# Patient Record
Sex: Female | Born: 1935 | Race: White | Hispanic: No | Smoking: Never smoker
Health system: Southern US, Community
[De-identification: ages and names within clinical notes are randomized; demographics above are authoritative.]

## PROBLEM LIST (undated history)

## (undated) DIAGNOSIS — I10 Essential (primary) hypertension: Secondary | ICD-10-CM

## (undated) DIAGNOSIS — M1990 Unspecified osteoarthritis, unspecified site: Secondary | ICD-10-CM

## (undated) DIAGNOSIS — C801 Malignant (primary) neoplasm, unspecified: Secondary | ICD-10-CM

## (undated) DIAGNOSIS — Z9889 Other specified postprocedural states: Secondary | ICD-10-CM

## (undated) DIAGNOSIS — F32A Depression, unspecified: Secondary | ICD-10-CM

## (undated) DIAGNOSIS — T4145XA Adverse effect of unspecified anesthetic, initial encounter: Secondary | ICD-10-CM

## (undated) DIAGNOSIS — Z87442 Personal history of urinary calculi: Secondary | ICD-10-CM

## (undated) DIAGNOSIS — T8859XA Other complications of anesthesia, initial encounter: Secondary | ICD-10-CM

## (undated) DIAGNOSIS — Z923 Personal history of irradiation: Secondary | ICD-10-CM

## (undated) DIAGNOSIS — K635 Polyp of colon: Secondary | ICD-10-CM

## (undated) DIAGNOSIS — F329 Major depressive disorder, single episode, unspecified: Secondary | ICD-10-CM

## (undated) DIAGNOSIS — C50919 Malignant neoplasm of unspecified site of unspecified female breast: Secondary | ICD-10-CM

## (undated) DIAGNOSIS — R112 Nausea with vomiting, unspecified: Secondary | ICD-10-CM

## (undated) HISTORY — DX: Unspecified osteoarthritis, unspecified site: M19.90

## (undated) HISTORY — DX: Personal history of irradiation: Z92.3

## (undated) HISTORY — DX: Malignant neoplasm of unspecified site of unspecified female breast: C50.919

## (undated) HISTORY — PX: ABDOMINAL HYSTERECTOMY: SHX81

## (undated) HISTORY — DX: Essential (primary) hypertension: I10

## (undated) HISTORY — DX: Malignant (primary) neoplasm, unspecified: C80.1

## (undated) HISTORY — DX: Polyp of colon: K63.5

---

## 1999-12-19 ENCOUNTER — Encounter: Payer: Self-pay | Admitting: Family Medicine

## 1999-12-19 ENCOUNTER — Encounter: Admission: RE | Admit: 1999-12-19 | Discharge: 1999-12-19 | Payer: Self-pay | Admitting: Family Medicine

## 2000-12-13 ENCOUNTER — Inpatient Hospital Stay (HOSPITAL_COMMUNITY): Admission: EM | Admit: 2000-12-13 | Discharge: 2000-12-15 | Payer: Self-pay | Admitting: Emergency Medicine

## 2000-12-14 ENCOUNTER — Encounter: Payer: Self-pay | Admitting: Urology

## 2000-12-21 ENCOUNTER — Encounter: Payer: Self-pay | Admitting: Family Medicine

## 2000-12-21 ENCOUNTER — Encounter: Admission: RE | Admit: 2000-12-21 | Discharge: 2000-12-21 | Payer: Self-pay | Admitting: Family Medicine

## 2000-12-28 ENCOUNTER — Encounter: Admission: RE | Admit: 2000-12-28 | Discharge: 2000-12-28 | Payer: Self-pay | Admitting: Urology

## 2000-12-28 ENCOUNTER — Encounter: Payer: Self-pay | Admitting: Urology

## 2001-04-02 ENCOUNTER — Encounter: Payer: Self-pay | Admitting: Urology

## 2001-04-02 ENCOUNTER — Encounter: Admission: RE | Admit: 2001-04-02 | Discharge: 2001-04-02 | Payer: Self-pay | Admitting: Urology

## 2001-06-17 ENCOUNTER — Ambulatory Visit (HOSPITAL_BASED_OUTPATIENT_CLINIC_OR_DEPARTMENT_OTHER): Admission: RE | Admit: 2001-06-17 | Discharge: 2001-06-17 | Payer: Self-pay | Admitting: Urology

## 2001-06-17 ENCOUNTER — Encounter: Payer: Self-pay | Admitting: Urology

## 2001-07-16 ENCOUNTER — Encounter: Payer: Self-pay | Admitting: Urology

## 2001-07-16 ENCOUNTER — Encounter: Admission: RE | Admit: 2001-07-16 | Discharge: 2001-07-16 | Payer: Self-pay | Admitting: Urology

## 2001-09-27 ENCOUNTER — Emergency Department (HOSPITAL_COMMUNITY): Admission: EM | Admit: 2001-09-27 | Discharge: 2001-09-27 | Payer: Self-pay | Admitting: Emergency Medicine

## 2001-09-27 ENCOUNTER — Encounter: Payer: Self-pay | Admitting: Emergency Medicine

## 2001-11-23 ENCOUNTER — Encounter: Admission: RE | Admit: 2001-11-23 | Discharge: 2001-11-23 | Payer: Self-pay | Admitting: Urology

## 2001-11-23 ENCOUNTER — Encounter: Payer: Self-pay | Admitting: Urology

## 2002-01-04 ENCOUNTER — Encounter: Admission: RE | Admit: 2002-01-04 | Discharge: 2002-01-04 | Payer: Self-pay | Admitting: Family Medicine

## 2002-01-04 ENCOUNTER — Encounter: Payer: Self-pay | Admitting: Family Medicine

## 2002-03-14 ENCOUNTER — Encounter: Admission: RE | Admit: 2002-03-14 | Discharge: 2002-03-14 | Payer: Self-pay | Admitting: Family Medicine

## 2002-03-14 ENCOUNTER — Encounter: Payer: Self-pay | Admitting: Family Medicine

## 2003-01-06 ENCOUNTER — Encounter: Admission: RE | Admit: 2003-01-06 | Discharge: 2003-01-06 | Payer: Self-pay | Admitting: Family Medicine

## 2003-01-06 ENCOUNTER — Encounter: Payer: Self-pay | Admitting: Family Medicine

## 2004-01-08 ENCOUNTER — Ambulatory Visit (HOSPITAL_COMMUNITY): Admission: RE | Admit: 2004-01-08 | Discharge: 2004-01-08 | Payer: Self-pay | Admitting: Family Medicine

## 2005-01-08 ENCOUNTER — Ambulatory Visit (HOSPITAL_COMMUNITY): Admission: RE | Admit: 2005-01-08 | Discharge: 2005-01-08 | Payer: Self-pay | Admitting: Family Medicine

## 2006-01-13 ENCOUNTER — Ambulatory Visit (HOSPITAL_COMMUNITY): Admission: RE | Admit: 2006-01-13 | Discharge: 2006-01-13 | Payer: Self-pay | Admitting: Family Medicine

## 2006-01-16 ENCOUNTER — Encounter: Admission: RE | Admit: 2006-01-16 | Discharge: 2006-01-16 | Payer: Self-pay | Admitting: Family Medicine

## 2007-02-09 ENCOUNTER — Ambulatory Visit (HOSPITAL_COMMUNITY): Admission: RE | Admit: 2007-02-09 | Discharge: 2007-02-09 | Payer: Self-pay | Admitting: Family Medicine

## 2008-02-16 ENCOUNTER — Ambulatory Visit (HOSPITAL_COMMUNITY): Admission: RE | Admit: 2008-02-16 | Discharge: 2008-02-16 | Payer: Self-pay | Admitting: Family Medicine

## 2009-03-16 ENCOUNTER — Ambulatory Visit (HOSPITAL_COMMUNITY): Admission: RE | Admit: 2009-03-16 | Discharge: 2009-03-16 | Payer: Self-pay | Admitting: Family Medicine

## 2009-06-09 HISTORY — PX: BREAST SURGERY: SHX581

## 2009-10-02 ENCOUNTER — Encounter: Admission: RE | Admit: 2009-10-02 | Discharge: 2009-10-02 | Payer: Self-pay | Admitting: Family Medicine

## 2010-04-02 ENCOUNTER — Ambulatory Visit (HOSPITAL_COMMUNITY): Admission: RE | Admit: 2010-04-02 | Discharge: 2010-04-02 | Payer: Self-pay | Admitting: Family Medicine

## 2010-04-17 ENCOUNTER — Encounter: Admission: RE | Admit: 2010-04-17 | Discharge: 2010-04-17 | Payer: Self-pay | Admitting: Family Medicine

## 2010-04-22 ENCOUNTER — Encounter: Admission: RE | Admit: 2010-04-22 | Discharge: 2010-04-22 | Payer: Self-pay | Admitting: Family Medicine

## 2010-04-30 ENCOUNTER — Encounter: Admission: RE | Admit: 2010-04-30 | Discharge: 2010-04-30 | Payer: Self-pay | Admitting: Family Medicine

## 2010-05-07 ENCOUNTER — Ambulatory Visit
Admission: RE | Admit: 2010-05-07 | Discharge: 2010-06-06 | Payer: Self-pay | Source: Home / Self Care | Attending: Radiation Oncology | Admitting: Radiation Oncology

## 2010-05-20 ENCOUNTER — Encounter
Admission: RE | Admit: 2010-05-20 | Discharge: 2010-05-20 | Payer: Self-pay | Source: Home / Self Care | Attending: Surgery | Admitting: Surgery

## 2010-05-20 ENCOUNTER — Ambulatory Visit: Payer: Self-pay | Admitting: Psychiatry

## 2010-05-23 ENCOUNTER — Encounter
Admission: RE | Admit: 2010-05-23 | Discharge: 2010-05-23 | Payer: Self-pay | Source: Home / Self Care | Attending: Surgery | Admitting: Surgery

## 2010-05-23 ENCOUNTER — Ambulatory Visit
Admission: RE | Admit: 2010-05-23 | Discharge: 2010-05-23 | Payer: Self-pay | Source: Home / Self Care | Attending: Surgery | Admitting: Surgery

## 2010-06-13 ENCOUNTER — Ambulatory Visit
Admission: RE | Admit: 2010-06-13 | Discharge: 2010-07-09 | Payer: Self-pay | Source: Home / Self Care | Attending: Radiation Oncology | Admitting: Radiation Oncology

## 2010-06-19 ENCOUNTER — Ambulatory Visit: Admit: 2010-06-19 | Payer: Self-pay | Admitting: Psychiatry

## 2010-06-30 ENCOUNTER — Encounter: Payer: Self-pay | Admitting: Family Medicine

## 2010-07-09 ENCOUNTER — Ambulatory Visit: Payer: Self-pay | Admitting: Oncology

## 2010-07-10 ENCOUNTER — Ambulatory Visit: Payer: Medicare Other | Attending: Radiation Oncology | Admitting: Radiation Oncology

## 2010-07-10 DIAGNOSIS — Z510 Encounter for antineoplastic radiation therapy: Secondary | ICD-10-CM | POA: Insufficient documentation

## 2010-07-10 DIAGNOSIS — C50419 Malignant neoplasm of upper-outer quadrant of unspecified female breast: Secondary | ICD-10-CM | POA: Insufficient documentation

## 2010-08-08 ENCOUNTER — Encounter: Payer: Medicare Other | Admitting: Oncology

## 2010-08-19 LAB — CBC
HCT: 42.7 % (ref 36.0–46.0)
Hemoglobin: 14.2 g/dL (ref 12.0–15.0)
MCV: 95.3 fL (ref 78.0–100.0)
RBC: 4.48 MIL/uL (ref 3.87–5.11)
WBC: 8.4 10*3/uL (ref 4.0–10.5)

## 2010-08-19 LAB — DIFFERENTIAL
Basophils Absolute: 0.1 10*3/uL (ref 0.0–0.1)
Basophils Relative: 1 % (ref 0–1)
Eosinophils Absolute: 0.1 10*3/uL (ref 0.0–0.7)
Monocytes Absolute: 0.6 10*3/uL (ref 0.1–1.0)
Neutro Abs: 6 10*3/uL (ref 1.7–7.7)
Neutrophils Relative %: 72 % (ref 43–77)

## 2010-08-19 LAB — COMPREHENSIVE METABOLIC PANEL
Alkaline Phosphatase: 48 U/L (ref 39–117)
BUN: 24 mg/dL — ABNORMAL HIGH (ref 6–23)
Chloride: 105 mEq/L (ref 96–112)
Glucose, Bld: 97 mg/dL (ref 70–99)
Potassium: 4.3 mEq/L (ref 3.5–5.1)
Total Bilirubin: 0.6 mg/dL (ref 0.3–1.2)

## 2010-09-02 ENCOUNTER — Ambulatory Visit: Payer: Medicare Other | Attending: Radiation Oncology | Admitting: Radiation Oncology

## 2010-09-30 ENCOUNTER — Encounter (HOSPITAL_BASED_OUTPATIENT_CLINIC_OR_DEPARTMENT_OTHER): Payer: Medicare Other | Admitting: Oncology

## 2010-09-30 DIAGNOSIS — M129 Arthropathy, unspecified: Secondary | ICD-10-CM

## 2010-09-30 DIAGNOSIS — C50419 Malignant neoplasm of upper-outer quadrant of unspecified female breast: Secondary | ICD-10-CM

## 2010-09-30 DIAGNOSIS — I10 Essential (primary) hypertension: Secondary | ICD-10-CM

## 2010-12-04 ENCOUNTER — Encounter (HOSPITAL_BASED_OUTPATIENT_CLINIC_OR_DEPARTMENT_OTHER): Payer: Medicare Other | Admitting: Oncology

## 2010-12-04 DIAGNOSIS — C50419 Malignant neoplasm of upper-outer quadrant of unspecified female breast: Secondary | ICD-10-CM

## 2010-12-04 DIAGNOSIS — M129 Arthropathy, unspecified: Secondary | ICD-10-CM

## 2010-12-04 DIAGNOSIS — I10 Essential (primary) hypertension: Secondary | ICD-10-CM

## 2011-03-10 ENCOUNTER — Ambulatory Visit
Admission: RE | Admit: 2011-03-10 | Discharge: 2011-03-10 | Disposition: A | Discharge status: Home / Self Care | Payer: Medicare Other | Source: Ambulatory Visit | Attending: Radiation Oncology | Admitting: Radiation Oncology

## 2011-03-11 ENCOUNTER — Other Ambulatory Visit: Payer: Self-pay | Admitting: Family Medicine

## 2011-03-11 DIAGNOSIS — C50919 Malignant neoplasm of unspecified site of unspecified female breast: Secondary | ICD-10-CM

## 2011-03-11 DIAGNOSIS — Z9889 Other specified postprocedural states: Secondary | ICD-10-CM

## 2011-04-04 ENCOUNTER — Ambulatory Visit
Admission: RE | Admit: 2011-04-04 | Discharge: 2011-04-04 | Disposition: A | Discharge status: Home / Self Care | Payer: Medicare Other | Source: Ambulatory Visit | Attending: Family Medicine | Admitting: Family Medicine

## 2011-04-04 DIAGNOSIS — C50919 Malignant neoplasm of unspecified site of unspecified female breast: Secondary | ICD-10-CM

## 2011-04-04 DIAGNOSIS — Z9889 Other specified postprocedural states: Secondary | ICD-10-CM

## 2011-05-07 ENCOUNTER — Encounter (INDEPENDENT_AMBULATORY_CARE_PROVIDER_SITE_OTHER): Payer: Self-pay | Admitting: General Surgery

## 2011-05-09 ENCOUNTER — Ambulatory Visit (INDEPENDENT_AMBULATORY_CARE_PROVIDER_SITE_OTHER): Payer: Medicare Other | Admitting: Surgery

## 2011-05-09 ENCOUNTER — Encounter (INDEPENDENT_AMBULATORY_CARE_PROVIDER_SITE_OTHER): Payer: Self-pay | Admitting: Surgery

## 2011-05-09 VITALS — BP 142/80 | HR 70 | Temp 97.8°F | Resp 16 | Ht 64.0 in | Wt 128.0 lb

## 2011-05-09 DIAGNOSIS — Z853 Personal history of malignant neoplasm of breast: Secondary | ICD-10-CM

## 2011-05-09 NOTE — Progress Notes (Signed)
Subjective:     Patient ID: Sherri Tucker, female   DOB: 06/24/1934, 76 y.o.   MRN: [id number]  HPI The patient returns for 6 month followup of history of left breast cancer. She underwent a lumpectomy with postoperative vision therapy for a T1 N0 MX ER/PR positive HER-2/neu negative left breast cancer in December 2011. She has no complaints.  Review of Systems  Constitutional: Negative for fever, chills and unexpected weight change.  HENT: Negative for hearing loss, congestion, sore throat, trouble swallowing and voice change.   Eyes: Negative for visual disturbance.  Respiratory: Negative for cough and wheezing.   Cardiovascular: Negative for chest pain, palpitations and leg swelling.  Gastrointestinal: Negative for nausea, vomiting, abdominal pain, diarrhea, constipation, blood in stool, abdominal distention and anal bleeding.  Genitourinary: Negative for hematuria, vaginal bleeding and difficulty urinating.  Musculoskeletal: Negative for arthralgias.  Skin: Negative for rash and wound.  Neurological: Negative for seizures, syncope and headaches.  Hematological: Negative for adenopathy. Does not bruise/bleed easily.  Psychiatric/Behavioral: Negative for confusion.       Objective:   Physical Exam  Constitutional: She appears well-developed and well-nourished.  HENT:  Head: Normocephalic and atraumatic.  Eyes: EOM are normal. Pupils are equal, round, and reactive to light.  Neck: Normal range of motion. Neck supple.  Cardiovascular: Normal rate and regular rhythm.   Pulmonary/Chest: Effort normal and breath sounds normal.       Left breast shows postsurgical changes. Minimal volume loss. No masses. Left axilla normal. Right breast normal. Right axilla normal.  Lymphadenopathy:    She has no cervical adenopathy.       Assessment:     Stage I left breast cancer history    Plan:     Her mammogram from October of this year was a BI-RADS 2. She is doing well. Return to see me  in one year

## 2011-05-09 NOTE — Patient Instructions (Signed)
Follow up in 1 year.

## 2011-05-16 ENCOUNTER — Telehealth: Payer: Self-pay | Admitting: Oncology

## 2011-05-16 NOTE — Telephone Encounter (Signed)
called pt and scheduled her appts for [id number]

## 2011-12-04 ENCOUNTER — Ambulatory Visit (HOSPITAL_BASED_OUTPATIENT_CLINIC_OR_DEPARTMENT_OTHER): Payer: Medicare Other | Admitting: Oncology

## 2011-12-04 ENCOUNTER — Other Ambulatory Visit: Payer: Medicare Other | Admitting: Lab

## 2011-12-04 ENCOUNTER — Encounter: Payer: Self-pay | Admitting: Oncology

## 2011-12-04 ENCOUNTER — Telehealth: Payer: Self-pay | Admitting: Oncology

## 2011-12-04 VITALS — BP 145/69 | HR 65 | Temp 98.0°F | Ht 64.0 in | Wt 127.3 lb

## 2011-12-04 DIAGNOSIS — C50419 Malignant neoplasm of upper-outer quadrant of unspecified female breast: Secondary | ICD-10-CM

## 2011-12-04 DIAGNOSIS — C50412 Malignant neoplasm of upper-outer quadrant of left female breast: Secondary | ICD-10-CM | POA: Insufficient documentation

## 2011-12-04 DIAGNOSIS — Z170 Estrogen receptor positive status [ER+]: Secondary | ICD-10-CM

## 2011-12-04 DIAGNOSIS — Z923 Personal history of irradiation: Secondary | ICD-10-CM

## 2011-12-04 DIAGNOSIS — M1990 Unspecified osteoarthritis, unspecified site: Secondary | ICD-10-CM

## 2011-12-04 DIAGNOSIS — C50919 Malignant neoplasm of unspecified site of unspecified female breast: Secondary | ICD-10-CM

## 2011-12-04 HISTORY — DX: Personal history of irradiation: Z92.3

## 2011-12-04 HISTORY — DX: Malignant neoplasm of unspecified site of unspecified female breast: C50.919

## 2011-12-04 HISTORY — DX: Unspecified osteoarthritis, unspecified site: M19.90

## 2011-12-04 LAB — COMPREHENSIVE METABOLIC PANEL
ALT: 11 U/L (ref 0–35)
AST: 17 U/L (ref 0–37)
Albumin: 4.2 g/dL (ref 3.5–5.2)
Alkaline Phosphatase: 52 U/L (ref 39–117)
BUN: 25 mg/dL — ABNORMAL HIGH (ref 6–23)
Calcium: 9.9 mg/dL (ref 8.4–10.5)
Chloride: 103 mEq/L (ref 96–112)
Potassium: 3.6 mEq/L (ref 3.5–5.3)
Sodium: 140 mEq/L (ref 135–145)
Total Protein: 7.1 g/dL (ref 6.0–8.3)

## 2011-12-04 LAB — CBC WITH DIFFERENTIAL/PLATELET
Basophils Absolute: 0.1 10*3/uL (ref 0.0–0.1)
EOS%: 1.3 % (ref 0.0–7.0)
HGB: 13.8 g/dL (ref 11.6–15.9)
MCH: 32.5 pg (ref 25.1–34.0)
NEUT#: 4.5 10*3/uL (ref 1.5–6.5)
RBC: 4.24 10*6/uL (ref 3.70–5.45)
RDW: 13.3 % (ref 11.2–14.5)
lymph#: 1.1 10*3/uL (ref 0.9–3.3)

## 2011-12-04 NOTE — Progress Notes (Signed)
OFFICE PROGRESS NOTE  CC  Sherri Confer, MD [address] [place] [address] Dr. Harriette Bouillon Dr. Antony Blackbird  DIAGNOSIS: 77 year old female with stage I invasive lobular carcinoma diagnosed December 2011.  PRIOR THERAPY:  #1 patient underwent a lumpectomy on 05/23/2010 that revealed a stage I invasive ductal carcinoma measuring 1.5 cm grade 1 ER positive PR positive with a Ki-67 of 8% and HER-2/neu negative. 2 sentinel nodes were negative for metastatic disease.  #2 patient went on to receive radiation therapy to the left breast from degenerative 31 2012 to 08/05/2010 for a total of 5000 cGY.  #3 patient declined any adjuvant antiestrogen therapy. And she has been on observation only.  CURRENT THERAPY:Observation  INTERVAL HISTORY: Sherri Tucker 77 y.o. female returns for Followup visit today overall she is doing well she denies any fevers chills night sweats headaches chest pains palpitations she has not noticed any masses. She had mammograms performed that revealed no evidence of local recurrence. Remainder of the 10 point review of systems is negative.  MEDICAL HISTORY: Past Medical History  Diagnosis Date  . Cancer   . Hemorrhoids   . Colon polyp   . Arthritis   . Hypertension   . Breast cancer, stage 1 12/04/2011  . Arthritis 12/04/2011  . History of radiation therapy 12/04/2011    ALLERGIES:  is allergic to codeine.  MEDICATIONS:  Current Outpatient Prescriptions  Medication Sig Dispense Refill  . aspirin 81 MG tablet Take 81 mg by mouth daily.        . fish oil-omega-3 fatty acids 1000 MG capsule Take 1,200 mg by mouth daily.        . hydrochlorothiazide (HYDRODIURIL) 25 MG tablet Take 25 mg by mouth daily.        Marland Kitchen lisinopril (PRINIVIL,ZESTRIL) 10 MG tablet Take 10 mg by mouth daily.        . metoprolol (TOPROL-XL) 100 MG 24 hr tablet Take 100 mg by mouth daily.        . Multiple Vitamin (MULTIVITAMIN) tablet Take 1 tablet by mouth daily.       . sertraline (ZOLOFT) 50 MG tablet Daily. Patient takes 1/2 tablet daily. Only taking 25 mg total a day.        SURGICAL HISTORY:  Past Surgical History  Procedure Date  . Abdominal hysterectomy     REVIEW OF SYSTEMS:  A comprehensive review of systems was negative.   PHYSICAL EXAMINATION: General appearance: alert and cooperative Neck: no adenopathy, no carotid bruit, no JVD, supple, symmetrical, trachea midline and thyroid not enlarged, symmetric, no tenderness/mass/nodules Lymph nodes: Cervical, supraclavicular, and axillary nodes normal. Resp: clear to auscultation bilaterally and normal percussion bilaterally Back: symmetric, no curvature. ROM normal. No CVA tenderness. Cardio: regular rate and rhythm, S1, S2 normal, no murmur, click, rub or gallop GI: soft, non-tender; bowel sounds normal; no masses,  no organomegaly Extremities: extremities normal, atraumatic, no cyanosis or edema Neurologic: Grossly normal Bilateral breast examination right breast no masses nipple discharge or skin changes left breast well-healed incisional scar without any masses nipple discharge. ECOG PERFORMANCE STATUS: 1 - Symptomatic but completely ambulatory  Blood pressure 145/69, pulse 65, temperature 98 F (36.7 C), temperature source Oral, height 5\' 4"  (1.626 m), weight 127 lb 4.8 oz (57.743 kg).  LABORATORY DATA: Lab Results  Component Value Date   WBC 6.1 12/04/2011   HGB 13.8 12/04/2011   HCT 40.4 12/04/2011   MCV 95.3 12/04/2011   PLT 197 12/04/2011  Chemistry      Component Value Date/Time   NA 141 05/20/2010 1132   K 4.3 05/20/2010 1132   CL 105 05/20/2010 1132   CO2 28 05/20/2010 1132   BUN 24* 05/20/2010 1132   CREATININE 0.83 05/20/2010 1132      Component Value Date/Time   CALCIUM 9.9 05/20/2010 1132   ALKPHOS 48 05/20/2010 1132   AST 22 05/20/2010 1132   ALT 18 05/20/2010 1132   BILITOT 0.6 05/20/2010 1132       RADIOGRAPHIC STUDIES:  No results  found.  ASSESSMENT: 77 year old female with history of stage I invasive lobular carcinoma measuring 1.5 cm grade 1 ER positive PR positive with a Ki-67 of 8% and HER-2/neu negative. Patient had 2 sentinel nodes that were negative for metastatic disease. Postoperatively she received radiation therapy to the breast and completed this 04/05/2011. She declined antiestrogen therapy. She presents today for followup.   PLAN: Overall patient is doing well she is really without any significant complaints her mammograms are up-to-date. At this time she and I discussed continuing followups on a yearly basis.   All questions were answered. The patient knows to call the clinic with any problems, questions or concerns. We can certainly see the patient much sooner if necessary.  I spent 25 minutes counseling the patient face to face. The total time spent in the appointment was 30 minutes.    Drue Second, MD Medical/Oncology [facility] [phone] (beeper) [phone] (Office)  12/04/2011, 11:51 AM

## 2011-12-04 NOTE — Patient Instructions (Addendum)
1. You are doing well. Your blood work from today looks good.  2. i will continue to see you once a year

## 2011-12-04 NOTE — Telephone Encounter (Signed)
gve the pt her June 2014 appt calendar 

## 2012-03-10 ENCOUNTER — Other Ambulatory Visit: Payer: Self-pay | Admitting: Family Medicine

## 2012-03-10 DIAGNOSIS — Z853 Personal history of malignant neoplasm of breast: Secondary | ICD-10-CM

## 2012-04-05 ENCOUNTER — Ambulatory Visit
Admission: RE | Admit: 2012-04-05 | Discharge: 2012-04-05 | Disposition: A | Discharge status: Home / Self Care | Payer: Medicare Other | Source: Ambulatory Visit | Attending: Family Medicine | Admitting: Family Medicine

## 2012-04-05 DIAGNOSIS — Z853 Personal history of malignant neoplasm of breast: Secondary | ICD-10-CM

## 2012-04-12 ENCOUNTER — Ambulatory Visit (INDEPENDENT_AMBULATORY_CARE_PROVIDER_SITE_OTHER): Payer: Medicare Other | Admitting: Surgery

## 2012-04-12 ENCOUNTER — Encounter (INDEPENDENT_AMBULATORY_CARE_PROVIDER_SITE_OTHER): Payer: Self-pay | Admitting: Surgery

## 2012-04-12 VITALS — BP 130/76 | HR 64 | Temp 97.2°F | Resp 14 | Ht 63.0 in | Wt 125.2 lb

## 2012-04-12 DIAGNOSIS — Z853 Personal history of malignant neoplasm of breast: Secondary | ICD-10-CM

## 2012-04-12 NOTE — Progress Notes (Signed)
Subjective:     Patient ID: Sherri Tucker, female   DOB: [birth year], 77 y.o.   MRN: [id number]  HPI The patient returns for 12 month followup of history of left breast cancer. She underwent a lumpectomy with postoperative vision therapy for a T1 N0 MX ER/PR positive HER-2/neu negative left breast cancer in December 2011. She has no complaints.  Review of Systems  Constitutional: Negative for fever, chills and unexpected weight change.  HENT: Negative for hearing loss, congestion, sore throat, trouble swallowing and voice change.   Eyes: Negative for visual disturbance.  Respiratory: Negative for cough and wheezing.   Cardiovascular: Negative for chest pain, palpitations and leg swelling.  Gastrointestinal: Negative for nausea, vomiting, abdominal pain, diarrhea, constipation, blood in stool, abdominal distention and anal bleeding.  Genitourinary: Negative for hematuria, vaginal bleeding and difficulty urinating.  Musculoskeletal: Negative for arthralgias.  Skin: Negative for rash and wound.  Neurological: Negative for seizures, syncope and headaches.  Hematological: Negative for adenopathy. Does not bruise/bleed easily.  Psychiatric/Behavioral: Negative for confusion.       Objective:   Physical Exam  Constitutional: She appears well-developed and well-nourished.  HENT:  Head: Normocephalic and atraumatic.  Eyes: EOM are normal. Pupils are equal, round, and reactive to light.  Neck: Normal range of motion. Neck supple.  Cardiovascular: Normal rate and regular rhythm.   Pulmonary/Chest: Effort normal and breath sounds normal.       Left breast shows postsurgical changes. Minimal volume loss. No masses. Left axilla normal. Right breast normal. Right axilla normal.  Lymphadenopathy:    She has no cervical adenopathy.  Clinical Data: History of left breast cancer, diagnosed in 2011.  Annual examination.  DIGITAL DIAGNOSTIC BILATERAL MAMMOGRAM WITH CAD  Comparison: With priors    Findings: The breast parenchyma is extremely dense bilaterally.  There are lumpectomy changes in the superior left breast centrally.  No mass, nonsurgical distortion, or suspicious mass occasion is  identified in either breast to suggest malignancy.  Mammographic images were processed with CAD.  IMPRESSION:  Surgical scarring in the upper central left breast. No  mammographic evidence of malignancy  RECOMMENDATION:  Bilateral diagnostic mammogram in 1 year. Given the patient's  extremely dense breast parenchyma, screening breast MRI could be  considered.  I have discussed the findings and recommendations with the patient.  Results were also provided in writing at the conclusion of the  visit.  BI-RADS CATEGORY 2: Benign finding(s).  Original Report Authenticated By: Britta Mccreedy, M.D.       Assessment:     Stage I left breast cancer history    Plan:     Her mammogram from October of this year was a BI-RADS 2. She is doing well. Return to see me in one year

## 2012-04-12 NOTE — Patient Instructions (Signed)
Return 1 year. 

## 2012-12-06 ENCOUNTER — Other Ambulatory Visit: Payer: Medicare Other | Admitting: Lab

## 2012-12-06 ENCOUNTER — Ambulatory Visit (HOSPITAL_BASED_OUTPATIENT_CLINIC_OR_DEPARTMENT_OTHER): Payer: 59 | Admitting: Oncology

## 2012-12-06 ENCOUNTER — Telehealth: Payer: Self-pay | Admitting: *Deleted

## 2012-12-06 ENCOUNTER — Encounter: Payer: Self-pay | Admitting: Oncology

## 2012-12-06 VITALS — BP 148/73 | HR 67 | Temp 98.0°F | Resp 20 | Ht 63.0 in | Wt 125.8 lb

## 2012-12-06 DIAGNOSIS — C50919 Malignant neoplasm of unspecified site of unspecified female breast: Secondary | ICD-10-CM

## 2012-12-06 DIAGNOSIS — C50912 Malignant neoplasm of unspecified site of left female breast: Secondary | ICD-10-CM

## 2012-12-06 NOTE — Telephone Encounter (Signed)
appts made and printed...td 

## 2012-12-06 NOTE — Progress Notes (Signed)
OFFICE PROGRESS NOTE  CC  Elie Confer, MD [address] [place] [address] Dr. Harriette Bouillon Dr. Antony Blackbird  DIAGNOSIS: 78 year old female with stage I invasive lobular carcinoma diagnosed December 2011.  PRIOR THERAPY:  #1 patient underwent a lumpectomy on 05/23/2010 that revealed a stage I invasive ductal carcinoma measuring 1.5 cm grade 1 ER positive PR positive with a Ki-67 of 8% and HER-2/neu negative. 2 sentinel nodes were negative for metastatic disease.  #2 patient went on to receive radiation therapy to the left breast from degenerative 31 2012 to 08/05/2010 for a total of 5000 cGY.  #3 patient declined any adjuvant antiestrogen therapy. And she has been on observation only.  CURRENT THERAPY:Observation  INTERVAL HISTORY: Sherri Tucker 78 y.o. female returns for Followup visit today overall she is doing well she denies any fevers chills night sweats headaches chest pains palpitations she has not noticed any masses. She had mammograms performed that revealed no evidence of local recurrence. Remainder of the 10 point review of systems is negative.  MEDICAL HISTORY: Past Medical History  Diagnosis Date  . Cancer   . Hemorrhoids   . Colon polyp   . Arthritis   . Hypertension   . Breast cancer, stage 1 12/04/2011  . Arthritis 12/04/2011  . History of radiation therapy 12/04/2011    ALLERGIES:  is allergic to codeine.  MEDICATIONS:  Current Outpatient Prescriptions  Medication Sig Dispense Refill  . aspirin 81 MG tablet Take 81 mg by mouth daily.        . Cholecalciferol (VITAMIN D-3) 1000 UNITS CAPS Take 1,000 Units by mouth daily.      . fish oil-omega-3 fatty acids 1000 MG capsule Take 1,200 mg by mouth daily.        Marland Kitchen glucosamine-chondroitin 500-400 MG tablet Take 1 tablet by mouth 2 (two) times daily.      . hydrochlorothiazide (HYDRODIURIL) 25 MG tablet Take 25 mg by mouth daily.        Marland Kitchen lisinopril (PRINIVIL,ZESTRIL) 10 MG tablet Take  10 mg by mouth daily.        . metoprolol (TOPROL-XL) 100 MG 24 hr tablet Take 100 mg by mouth daily.        . Multiple Vitamin (MULTIVITAMIN) tablet Take 1 tablet by mouth daily.      . sertraline (ZOLOFT) 50 MG tablet Daily. Patient takes 1/2 tablet daily. Only taking 25 mg total a day.       No current facility-administered medications for this visit.    SURGICAL HISTORY:  Past Surgical History  Procedure Laterality Date  . Abdominal hysterectomy      REVIEW OF SYSTEMS:  A comprehensive review of systems was negative.   PHYSICAL EXAMINATION: General appearance: alert and cooperative Neck: no adenopathy, no carotid bruit, no JVD, supple, symmetrical, trachea midline and thyroid not enlarged, symmetric, no tenderness/mass/nodules Lymph nodes: Cervical, supraclavicular, and axillary nodes normal. Resp: clear to auscultation bilaterally and normal percussion bilaterally Back: symmetric, no curvature. ROM normal. No CVA tenderness. Cardio: regular rate and rhythm, S1, S2 normal, no murmur, click, rub or gallop GI: soft, non-tender; bowel sounds normal; no masses,  no organomegaly Extremities: extremities normal, atraumatic, no cyanosis or edema Neurologic: Grossly normal Bilateral breast examination right breast no masses nipple discharge or skin changes left breast well-healed incisional scar without any masses nipple discharge. ECOG PERFORMANCE STATUS: 1 - Symptomatic but completely ambulatory  Blood pressure 148/73, pulse 67, temperature 98 F (36.7 C), temperature source Oral,  resp. rate 20, height 5\' 3"  (1.6 m), weight 125 lb 12.8 oz (57.063 kg).  LABORATORY DATA: Lab Results  Component Value Date   WBC 6.1 12/04/2011   HGB 13.8 12/04/2011   HCT 40.4 12/04/2011   MCV 95.3 12/04/2011   PLT 197 12/04/2011      Chemistry      Component Value Date/Time   NA 140 12/04/2011 1027   K 3.6 12/04/2011 1027   CL 103 12/04/2011 1027   CO2 29 12/04/2011 1027   BUN 25* 12/04/2011 1027    CREATININE 0.94 12/04/2011 1027      Component Value Date/Time   CALCIUM 9.9 12/04/2011 1027   ALKPHOS 52 12/04/2011 1027   AST 17 12/04/2011 1027   ALT 11 12/04/2011 1027   BILITOT 0.4 12/04/2011 1027       RADIOGRAPHIC STUDIES:  No results found.  ASSESSMENT: 78 year old female with history of stage I invasive lobular carcinoma measuring 1.5 cm grade 1 ER positive PR positive with a Ki-67 of 8% and HER-2/neu negative. Patient had 2 sentinel nodes that were negative for metastatic disease. Postoperatively she received radiation therapy to the breast and completed this 04/05/2011. She declined antiestrogen therapy. She presents today for followup.   PLAN: Overall patient is doing well she is really without any significant complaints her mammograms are up-to-date. At this time she and I discussed continuing followups on a yearly basis.   All questions were answered. The patient knows to call the clinic with any problems, questions or concerns. We can certainly see the patient much sooner if necessary.  I spent 15 minutes counseling the patient face to face. The total time spent in the appointment was 30 minutes.    Drue Second, MD Medical/Oncology [facility] [phone] (beeper) [phone] (Office)  12/06/2012, 11:49 AM

## 2012-12-06 NOTE — Patient Instructions (Addendum)
Doing well  I will see you back in 1 year

## 2013-03-03 ENCOUNTER — Other Ambulatory Visit: Payer: Self-pay

## 2013-03-03 DIAGNOSIS — Z853 Personal history of malignant neoplasm of breast: Secondary | ICD-10-CM

## 2013-03-03 DIAGNOSIS — Z9889 Other specified postprocedural states: Secondary | ICD-10-CM

## 2013-04-08 ENCOUNTER — Ambulatory Visit
Admission: RE | Admit: 2013-04-08 | Discharge: 2013-04-08 | Disposition: A | Discharge status: Home / Self Care | Payer: Medicare Other | Source: Ambulatory Visit

## 2013-04-08 DIAGNOSIS — Z853 Personal history of malignant neoplasm of breast: Secondary | ICD-10-CM

## 2013-04-08 DIAGNOSIS — Z9889 Other specified postprocedural states: Secondary | ICD-10-CM

## 2013-04-29 ENCOUNTER — Encounter (INDEPENDENT_AMBULATORY_CARE_PROVIDER_SITE_OTHER): Payer: Self-pay | Admitting: Surgery

## 2013-04-29 ENCOUNTER — Ambulatory Visit (INDEPENDENT_AMBULATORY_CARE_PROVIDER_SITE_OTHER): Payer: Medicare Other | Admitting: Surgery

## 2013-04-29 VITALS — BP 128/72 | HR 56 | Temp 98.6°F | Resp 14 | Ht 63.0 in | Wt 123.0 lb

## 2013-04-29 DIAGNOSIS — Z853 Personal history of malignant neoplasm of breast: Secondary | ICD-10-CM

## 2013-04-29 NOTE — Patient Instructions (Signed)
Return in 1 year ?

## 2013-04-29 NOTE — Progress Notes (Signed)
Subjective:     Patient ID: Sherri Tucker, female   DOB: 09-Mar-1935, 78 y.o.   MRN: [id number]  HPI The patient returns for 12 month followup of history of left breast cancer. She underwent a lumpectomy with postoperative vision therapy for a T1 N0 MX ER/PR positive HER-2/neu negative left breast cancer in December 2011. She has no complaints.  Review of Systems  Constitutional: Negative for fever, chills and unexpected weight change.  HENT: Negative for hearing loss, congestion, sore throat, trouble swallowing and voice change.   Eyes: Negative for visual disturbance.  Respiratory: Negative for cough and wheezing.   Cardiovascular: Negative for chest pain, palpitations and leg swelling.  Gastrointestinal: Negative for nausea, vomiting, abdominal pain, diarrhea, constipation, blood in stool, abdominal distention and anal bleeding.  Genitourinary: Negative for hematuria, vaginal bleeding and difficulty urinating.  Musculoskeletal: Negative for arthralgias.  Skin: Negative for rash and wound.  Neurological: Negative for seizures, syncope and headaches.  Hematological: Negative for adenopathy. Does not bruise/bleed easily.  Psychiatric/Behavioral: Negative for confusion.       Objective:   Physical Exam  Constitutional: She appears well-developed and well-nourished.  HENT:  Head: Normocephalic and atraumatic.  Eyes: EOM are normal. Pupils are equal, round, and reactive to light.  Neck: Normal range of motion. Neck supple.  Cardiovascular: Normal rate and regular rhythm.   Pulmonary/Chest: Effort normal and breath sounds normal.       Left breast shows postsurgical changes. Minimal volume loss. No masses. Left axilla normal. Right breast normal. Right axilla normal.  Lymphadenopathy:    She has no cervical adenopathy.  CLINICAL DATA: Post malignant lumpectomy of the left breast in  December of 2011 with subsequent radiation therapy.  EXAM:  DIGITAL DIAGNOSTIC BILATERAL MAMMOGRAM WITH  CAD  DIGITAL BREAST TOMOSYNTHESIS  Digital breast tomosynthesis images are acquired in two projections.  These images are reviewed in combination with the digital mammogram,  confirming the findings below.  COMPARISON: 04/05/2012 and prior  ACR Breast Density Category c: The breasts are heterogeneously  dense, which may obscure small masses.  FINDINGS:  Postlumpectomy changes are identified in the left breast at 12  o'clock. There has been some further retraction in the region of the  lumpectomy site with increase in central fat compatible with fat  necrosis. No worrisome mass lesions, abnormal calcifications or new  areas of non surgical architectural distortion are seen to suggest  malignancy in either breast.  Mammographic images were processed with CAD.  IMPRESSION:  Expected postoperative changes in the left breast postlumpectomy. No  mammographic features worrisome for malignancy.  RECOMMENDATION:  Followup diagnostic mammography is recommended in 1 year of both  breasts.  I have discussed the findings and recommendations with the patient.  Results were also provided in writing at the conclusion of the  visit. If applicable, a reminder letter will be sent to the patient  regarding the next appointment.  BI-RADS CATEGORY 2: Benign Finding(s)  Electronically Signed  By: Leda Gauze M.D.  On: 04/08/2013 13:50      Assessment:     Stage I left breast cancer history    Plan:     Her mammogram from October of this year was a BI-RADS 2. She is doing well. Return to see me in one year

## 2013-11-29 ENCOUNTER — Telehealth: Payer: Self-pay | Admitting: Hematology and Oncology

## 2013-11-29 NOTE — Telephone Encounter (Signed)
, °

## 2013-12-09 ENCOUNTER — Ambulatory Visit: Payer: 59 | Admitting: Oncology

## 2013-12-14 ENCOUNTER — Ambulatory Visit: Payer: Medicare Other | Attending: Family Medicine

## 2013-12-14 DIAGNOSIS — R5381 Other malaise: Secondary | ICD-10-CM | POA: Diagnosis not present

## 2013-12-14 DIAGNOSIS — M810 Age-related osteoporosis without current pathological fracture: Secondary | ICD-10-CM | POA: Insufficient documentation

## 2013-12-14 DIAGNOSIS — M169 Osteoarthritis of hip, unspecified: Secondary | ICD-10-CM | POA: Insufficient documentation

## 2013-12-14 DIAGNOSIS — C50919 Malignant neoplasm of unspecified site of unspecified female breast: Secondary | ICD-10-CM | POA: Insufficient documentation

## 2013-12-14 DIAGNOSIS — M25659 Stiffness of unspecified hip, not elsewhere classified: Secondary | ICD-10-CM | POA: Diagnosis not present

## 2013-12-14 DIAGNOSIS — M25559 Pain in unspecified hip: Secondary | ICD-10-CM | POA: Insufficient documentation

## 2013-12-14 DIAGNOSIS — M899 Disorder of bone, unspecified: Secondary | ICD-10-CM | POA: Insufficient documentation

## 2013-12-14 DIAGNOSIS — M1610 Unilateral primary osteoarthritis, unspecified hip: Secondary | ICD-10-CM | POA: Insufficient documentation

## 2013-12-14 DIAGNOSIS — M949 Disorder of cartilage, unspecified: Secondary | ICD-10-CM | POA: Diagnosis not present

## 2013-12-14 DIAGNOSIS — IMO0001 Reserved for inherently not codable concepts without codable children: Secondary | ICD-10-CM | POA: Insufficient documentation

## 2013-12-14 DIAGNOSIS — I10 Essential (primary) hypertension: Secondary | ICD-10-CM | POA: Insufficient documentation

## 2013-12-19 ENCOUNTER — Ambulatory Visit: Payer: Medicare Other

## 2013-12-19 DIAGNOSIS — IMO0001 Reserved for inherently not codable concepts without codable children: Secondary | ICD-10-CM | POA: Diagnosis not present

## 2013-12-22 ENCOUNTER — Ambulatory Visit: Payer: Medicare Other

## 2013-12-22 DIAGNOSIS — IMO0001 Reserved for inherently not codable concepts without codable children: Secondary | ICD-10-CM | POA: Diagnosis not present

## 2013-12-26 ENCOUNTER — Ambulatory Visit: Payer: Medicare Other

## 2013-12-26 DIAGNOSIS — IMO0001 Reserved for inherently not codable concepts without codable children: Secondary | ICD-10-CM | POA: Diagnosis not present

## 2013-12-28 ENCOUNTER — Ambulatory Visit: Payer: Medicare Other | Admitting: Physical Therapy

## 2013-12-28 DIAGNOSIS — IMO0001 Reserved for inherently not codable concepts without codable children: Secondary | ICD-10-CM | POA: Diagnosis not present

## 2014-01-02 ENCOUNTER — Ambulatory Visit: Payer: Medicare Other | Admitting: Physical Therapy

## 2014-01-02 DIAGNOSIS — IMO0001 Reserved for inherently not codable concepts without codable children: Secondary | ICD-10-CM | POA: Diagnosis not present

## 2014-01-04 ENCOUNTER — Ambulatory Visit: Payer: Medicare Other | Admitting: Physical Therapy

## 2014-01-04 DIAGNOSIS — IMO0001 Reserved for inherently not codable concepts without codable children: Secondary | ICD-10-CM | POA: Diagnosis not present

## 2014-01-09 ENCOUNTER — Ambulatory Visit: Payer: Medicare Other | Attending: Family Medicine | Admitting: Physical Therapy

## 2014-01-09 DIAGNOSIS — M25559 Pain in unspecified hip: Secondary | ICD-10-CM | POA: Diagnosis not present

## 2014-01-09 DIAGNOSIS — I10 Essential (primary) hypertension: Secondary | ICD-10-CM | POA: Insufficient documentation

## 2014-01-09 DIAGNOSIS — M169 Osteoarthritis of hip, unspecified: Secondary | ICD-10-CM | POA: Insufficient documentation

## 2014-01-09 DIAGNOSIS — C50919 Malignant neoplasm of unspecified site of unspecified female breast: Secondary | ICD-10-CM | POA: Diagnosis not present

## 2014-01-09 DIAGNOSIS — R5381 Other malaise: Secondary | ICD-10-CM | POA: Insufficient documentation

## 2014-01-09 DIAGNOSIS — M810 Age-related osteoporosis without current pathological fracture: Secondary | ICD-10-CM | POA: Diagnosis not present

## 2014-01-09 DIAGNOSIS — IMO0001 Reserved for inherently not codable concepts without codable children: Secondary | ICD-10-CM | POA: Insufficient documentation

## 2014-01-09 DIAGNOSIS — M1610 Unilateral primary osteoarthritis, unspecified hip: Secondary | ICD-10-CM | POA: Insufficient documentation

## 2014-01-09 DIAGNOSIS — M25659 Stiffness of unspecified hip, not elsewhere classified: Secondary | ICD-10-CM | POA: Diagnosis not present

## 2014-01-09 DIAGNOSIS — M899 Disorder of bone, unspecified: Secondary | ICD-10-CM | POA: Insufficient documentation

## 2014-01-09 DIAGNOSIS — M949 Disorder of cartilage, unspecified: Secondary | ICD-10-CM

## 2014-01-11 ENCOUNTER — Ambulatory Visit: Payer: Medicare Other

## 2014-01-11 DIAGNOSIS — IMO0001 Reserved for inherently not codable concepts without codable children: Secondary | ICD-10-CM | POA: Diagnosis not present

## 2014-01-23 ENCOUNTER — Ambulatory Visit: Payer: Medicare Other | Admitting: Physical Therapy

## 2014-01-23 DIAGNOSIS — IMO0001 Reserved for inherently not codable concepts without codable children: Secondary | ICD-10-CM | POA: Diagnosis not present

## 2014-01-30 ENCOUNTER — Ambulatory Visit: Payer: Medicare Other

## 2014-01-30 DIAGNOSIS — IMO0001 Reserved for inherently not codable concepts without codable children: Secondary | ICD-10-CM | POA: Diagnosis not present

## 2014-03-07 ENCOUNTER — Encounter: Payer: Self-pay | Admitting: Hematology and Oncology

## 2014-03-07 ENCOUNTER — Ambulatory Visit: Payer: Medicare Other | Admitting: Hematology and Oncology

## 2014-03-07 VITALS — BP 157/60 | HR 85 | Temp 98.1°F | Resp 18 | Ht 63.0 in | Wt 107.8 lb

## 2014-03-07 DIAGNOSIS — Z170 Estrogen receptor positive status [ER+]: Secondary | ICD-10-CM

## 2014-03-07 DIAGNOSIS — C50912 Malignant neoplasm of unspecified site of left female breast: Secondary | ICD-10-CM

## 2014-03-07 DIAGNOSIS — C50419 Malignant neoplasm of upper-outer quadrant of unspecified female breast: Secondary | ICD-10-CM

## 2014-03-07 NOTE — Assessment & Plan Note (Signed)
Left breast invasive ductal carcinoma T1 C. N0 M0 stage IA grade 1 ER/PR positive HER-2 negative Ki-67 8% 2 SLN negative status post lumpectomy and radiation. Patient refuses antiestrogen therapy and is currently on observation.  Surveillance: She will be set up for a mammogram in October 2016. Today's breast exam did not reveal any palpable nodules or lumps. I recommended that she continue her annual followups with mammograms and breast exams.  Survivorship: I encouraged her to stay more active and do exercise. Stays very active with her church group and is a devote [employer]. 30 minutes of exercise every day for 5 days a week is what I recommended her. She does yoga once a week in the church. 

## 2014-03-07 NOTE — Progress Notes (Signed)
Patient Care Team: [name], MD as PCP - General (Family Medicine)  DIAGNOSIS: No matching staging information was found for the patient.  SUMMARY OF ONCOLOGIC HISTORY:   Breast cancer of upper-outer quadrant of left female breast   05/23/2010 Surgery Left breast lumpectomy 1.5 cm grade 1 stage IA T1 C. N0 M0 invasive ductal carcinoma ER/PR positive HER-2 negative Ki-67 80%, 2 SLN negative   06/08/2010 - 08/05/2010 Radiation Therapy Radiation therapy to the left breast lumpectomy    Anti-estrogen oral therapy Patient declined antiestrogen therapy    CHIEF COMPLIANT: Annual followup of stage I breast cancer  INTERVAL HISTORY: Sherri Tucker is a 79 year old Caucasian lady with above-mentioned history of stage I breast cancer diagnosed in 2011 treated with lumpectomy and radiation. She declined adjuvant antiestrogen therapy. She has been on regular surveillance with mammograms and physical exams. She scheduled for a mammogram in October 2016. She reports no new problems or concerns. She is very independent and takes care of herself at all her needs. She stays active with her [employer] and while interviewing and participates in yoga that his provider free in her church. Her husband passed away long time ago and she had raised her son since he was 89 years old. She was originally an immigrant from [place].  REVIEW OF SYSTEMS:   Constitutional: Denies fevers, chills or abnormal weight loss, complains of left hip pain received injections Eyes: Denies blurriness of vision Ears, nose, mouth, throat, and face: Denies mucositis or sore throat Respiratory: Denies cough, dyspnea or wheezes Cardiovascular: Denies palpitation, chest discomfort or lower extremity swelling Gastrointestinal:  Denies nausea, heartburn or change in bowel habits Skin: Denies abnormal skin rashes Lymphatics: Denies new lymphadenopathy or easy bruising Neurological:Denies numbness, tingling or new  weaknesses Behavioral/Psych: Mood is stable, no new changes  Breast:  denies any pain or lumps or nodules in either breasts All other systems were reviewed with the patient and are negative.  I have reviewed the past medical history, past surgical history, social history and family history with the patient and they are unchanged from previous note.  ALLERGIES:  is allergic to codeine.  MEDICATIONS:  Current Outpatient Prescriptions  Medication Sig Dispense Refill  . aspirin 81 MG tablet Take 81 mg by mouth daily.        . Cholecalciferol (VITAMIN D-3) 1000 UNITS CAPS Take 1,000 Units by mouth daily.      . fish oil-omega-3 fatty acids 1000 MG capsule Take 1,200 mg by mouth daily.        Marland Kitchen glucosamine-chondroitin 500-400 MG tablet Take 1 tablet by mouth 2 (two) times daily.      . hydrochlorothiazide (HYDRODIURIL) 25 MG tablet Take 25 mg by mouth daily.        Marland Kitchen lisinopril (PRINIVIL,ZESTRIL) 10 MG tablet Take 10 mg by mouth daily.        . metoprolol (TOPROL-XL) 100 MG 24 hr tablet Take 100 mg by mouth daily.        . Multiple Vitamin (MULTIVITAMIN) tablet Take 1 tablet by mouth daily.      . sertraline (ZOLOFT) 50 MG tablet Daily. Patient takes 1/2 tablet daily. Only taking 25 mg total a day.      . traMADol (ULTRAM) 50 MG tablet        No current facility-administered medications for this visit.    PHYSICAL EXAMINATION: ECOG PERFORMANCE STATUS: 0 - Asymptomatic  Filed Vitals:   03/07/14 1121  BP: 157/60  Pulse: 85  Temp: 98.1 F (36.7 C)  Resp: 18   Filed Weights   03/07/14 1121  Weight: 107 lb 12.8 oz (48.898 kg)    GENERAL:alert, no distress and comfortable SKIN: skin color, texture, turgor are normal, no rashes or significant lesions EYES: normal, Conjunctiva are pink and non-injected, sclera clear OROPHARYNX:no exudate, no erythema and lips, buccal mucosa, and tongue normal  NECK: supple, thyroid normal size, non-tender, without nodularity LYMPH:  no palpable  lymphadenopathy in the cervical, axillary or inguinal LUNGS: clear to auscultation and percussion with normal breathing effort HEART: regular rate & rhythm and no murmurs and no lower extremity edema ABDOMEN:abdomen soft, non-tender and normal bowel sounds Musculoskeletal: Left hip pain makes it difficult to walk NEURO: alert & oriented x 3 with fluent speech, no focal motor/sensory deficits BREAST: No palpable masses or nodules in either right or left breasts. No palpable axillary supraclavicular or infraclavicular adenopathy no breast tenderness or nipple discharge.   LABORATORY DATA:  I have reviewed the data as listed   Chemistry      Component Value Date/Time   NA 140 12/04/2011 1027   K 3.6 12/04/2011 1027   CL 103 12/04/2011 1027   CO2 29 12/04/2011 1027   BUN 25* 12/04/2011 1027   CREATININE 0.94 12/04/2011 1027      Component Value Date/Time   CALCIUM 9.9 12/04/2011 1027   ALKPHOS 52 12/04/2011 1027   AST 17 12/04/2011 1027   ALT 11 12/04/2011 1027   BILITOT 0.4 12/04/2011 1027       Lab Results  Component Value Date   WBC 6.1 12/04/2011   HGB 13.8 12/04/2011   HCT 40.4 12/04/2011   MCV 95.3 12/04/2011   PLT 197 12/04/2011   NEUTROABS 4.5 12/04/2011     RADIOGRAPHIC STUDIES: I have personally reviewed the radiology reports and agreed with their findings. No results found.   ASSESSMENT & PLAN:  Breast cancer of upper-outer quadrant of left female breast Left breast invasive ductal carcinoma T1 C. N0 M0 stage IA grade 1 ER/PR positive HER-2 negative Ki-67 8% 2 SLN negative status post lumpectomy and radiation. Patient refuses antiestrogen therapy and is currently on observation.  Surveillance: She will be set up for a mammogram in October 2016. Today's breast exam did not reveal any palpable nodules or lumps. I recommended that she continue her annual followups with mammograms and breast exams.  Survivorship: I encouraged her to stay more active and do exercise. Stays very  active with her church group and is a devote [employer]. 30 minutes of exercise every day for 5 days a week is what I recommended her. She does yoga once a week in the church.   No orders of the defined types were placed in this encounter.   The patient has a good understanding of the overall plan. she agrees with it. She will call with any problems that may develop before her next visit here.  I spent 15 minutes counseling the patient face to face. The total time spent in the appointment was 20 minutes and more than 50% was on counseling and review of test results    Rulon Eisenmenger, MD 03/07/2014 11:43 AM

## 2014-03-09 NOTE — Addendum Note (Signed)
Addended by: Prentiss Bells on: 03/09/2014 02:54 PM   Modules accepted: Orders

## 2014-04-10 ENCOUNTER — Ambulatory Visit
Admission: RE | Admit: 2014-04-10 | Discharge: 2014-04-10 | Disposition: A | Discharge status: Home / Self Care | Payer: Medicare Other | Source: Ambulatory Visit | Attending: Hematology and Oncology | Admitting: Hematology and Oncology

## 2014-04-10 DIAGNOSIS — C50912 Malignant neoplasm of unspecified site of left female breast: Secondary | ICD-10-CM

## 2014-06-13 ENCOUNTER — Encounter (HOSPITAL_COMMUNITY): Payer: Self-pay

## 2014-06-13 ENCOUNTER — Emergency Department (HOSPITAL_COMMUNITY): Payer: Medicare Other

## 2014-06-13 ENCOUNTER — Emergency Department (HOSPITAL_COMMUNITY)
Admission: EM | Admit: 2014-06-13 | Discharge: 2014-06-13 | Disposition: A | Discharge status: Home / Self Care | Payer: Medicare Other | Attending: Emergency Medicine | Admitting: Emergency Medicine

## 2014-06-13 DIAGNOSIS — Z86010 Personal history of colonic polyps: Secondary | ICD-10-CM | POA: Diagnosis not present

## 2014-06-13 DIAGNOSIS — Z8719 Personal history of other diseases of the digestive system: Secondary | ICD-10-CM | POA: Insufficient documentation

## 2014-06-13 DIAGNOSIS — I10 Essential (primary) hypertension: Secondary | ICD-10-CM | POA: Insufficient documentation

## 2014-06-13 DIAGNOSIS — N200 Calculus of kidney: Secondary | ICD-10-CM

## 2014-06-13 DIAGNOSIS — Z79899 Other long term (current) drug therapy: Secondary | ICD-10-CM | POA: Diagnosis not present

## 2014-06-13 DIAGNOSIS — Z859 Personal history of malignant neoplasm, unspecified: Secondary | ICD-10-CM | POA: Diagnosis not present

## 2014-06-13 DIAGNOSIS — N201 Calculus of ureter: Secondary | ICD-10-CM | POA: Diagnosis not present

## 2014-06-13 DIAGNOSIS — Z7982 Long term (current) use of aspirin: Secondary | ICD-10-CM | POA: Insufficient documentation

## 2014-06-13 DIAGNOSIS — Z853 Personal history of malignant neoplasm of breast: Secondary | ICD-10-CM | POA: Diagnosis not present

## 2014-06-13 DIAGNOSIS — R109 Unspecified abdominal pain: Secondary | ICD-10-CM | POA: Diagnosis present

## 2014-06-13 DIAGNOSIS — Z923 Personal history of irradiation: Secondary | ICD-10-CM | POA: Diagnosis not present

## 2014-06-13 DIAGNOSIS — M1990 Unspecified osteoarthritis, unspecified site: Secondary | ICD-10-CM | POA: Diagnosis not present

## 2014-06-13 LAB — CBC WITH DIFFERENTIAL/PLATELET
BASOS PCT: 0 % (ref 0–1)
Basophils Absolute: 0 10*3/uL (ref 0.0–0.1)
EOS ABS: 0 10*3/uL (ref 0.0–0.7)
EOS PCT: 0 % (ref 0–5)
HEMATOCRIT: 38.4 % (ref 36.0–46.0)
HEMOGLOBIN: 12.7 g/dL (ref 12.0–15.0)
Lymphocytes Relative: 2 % — ABNORMAL LOW (ref 12–46)
Lymphs Abs: 0.3 10*3/uL — ABNORMAL LOW (ref 0.7–4.0)
MCH: 32.4 pg (ref 26.0–34.0)
MCHC: 33.1 g/dL (ref 30.0–36.0)
MCV: 98 fL (ref 78.0–100.0)
MONO ABS: 0.5 10*3/uL (ref 0.1–1.0)
MONOS PCT: 4 % (ref 3–12)
Neutro Abs: 13.6 10*3/uL — ABNORMAL HIGH (ref 1.7–7.7)
Neutrophils Relative %: 94 % — ABNORMAL HIGH (ref 43–77)
Platelets: 171 10*3/uL (ref 150–400)
RBC: 3.92 MIL/uL (ref 3.87–5.11)
RDW: 13 % (ref 11.5–15.5)
WBC: 14.4 10*3/uL — AB (ref 4.0–10.5)

## 2014-06-13 LAB — URINALYSIS, ROUTINE W REFLEX MICROSCOPIC
Bilirubin Urine: NEGATIVE
GLUCOSE, UA: NEGATIVE mg/dL
KETONES UR: NEGATIVE mg/dL
NITRITE: NEGATIVE
PH: 5 (ref 5.0–8.0)
Protein, ur: NEGATIVE mg/dL
Specific Gravity, Urine: 1.018 (ref 1.005–1.030)
Urobilinogen, UA: 0.2 mg/dL (ref 0.0–1.0)

## 2014-06-13 LAB — BASIC METABOLIC PANEL
Anion gap: 11 (ref 5–15)
BUN: 34 mg/dL — AB (ref 6–23)
CHLORIDE: 105 meq/L (ref 96–112)
CO2: 25 mmol/L (ref 19–32)
CREATININE: 1.08 mg/dL (ref 0.50–1.10)
Calcium: 10 mg/dL (ref 8.4–10.5)
GFR calc non Af Amer: 48 mL/min — ABNORMAL LOW (ref >90)
GFR, EST AFRICAN AMERICAN: 56 mL/min — AB (ref >90)
Glucose, Bld: 158 mg/dL — ABNORMAL HIGH (ref 70–99)
Potassium: 3.4 mmol/L — ABNORMAL LOW (ref 3.5–5.1)
Sodium: 141 mmol/L (ref 135–145)

## 2014-06-13 LAB — URINE MICROSCOPIC-ADD ON

## 2014-06-13 MED ORDER — MORPHINE SULFATE 4 MG/ML IJ SOLN
4.0000 mg | Freq: Once | INTRAMUSCULAR | Status: AC
  Administered 2014-06-13: 4 mg via INTRAVENOUS
  Filled 2014-06-13: qty 1

## 2014-06-13 MED ORDER — OXYCODONE-ACETAMINOPHEN 5-325 MG PO TABS: 1.0000 | ORAL_TABLET | Freq: Four times a day (QID) | ORAL | Status: DC | PRN

## 2014-06-13 MED ORDER — ONDANSETRON HCL 8 MG PO TABS: 8.0000 mg | ORAL_TABLET | ORAL | Status: DC | PRN

## 2014-06-13 MED ORDER — ONDANSETRON HCL 4 MG/2ML IJ SOLN
4.0000 mg | Freq: Once | INTRAMUSCULAR | Status: AC
  Administered 2014-06-13: 4 mg via INTRAVENOUS
  Filled 2014-06-13: qty 2

## 2014-06-13 NOTE — ED Provider Notes (Signed)
CSN: [id number]     Arrival date & time 06/13/14  7253 History   First MD Initiated Contact with Patient 06/13/14 1021     Chief Complaint  Patient presents with  . Flank Pain     (Consider location/radiation/quality/duration/timing/severity/associated sxs/prior Treatment) HPI Comments: Patient is a 80 year old female history of hypertension, breast cancer, and renal calculi. She presents with complaints of severe left flank pain which started at approximately 1 AM and woke her from sleep. She took some laxatives that she felt she might be constipated. She has had bowel movements, however this has not relieved her discomfort. She is now feeling nauseated. She denies any dysuria, hematuria. She denies any fevers or chills.  Patient is a 80 y.o. female presenting with flank pain. The history is provided by the patient.  Flank Pain This is a new problem. Episode onset: 1 AM. The problem occurs constantly. The problem has been rapidly worsening. Nothing aggravates the symptoms. Nothing relieves the symptoms. She has tried nothing for the symptoms. The treatment provided no relief.    Past Medical History  Diagnosis Date  . Cancer   . Hemorrhoids   . Colon polyp   . Arthritis   . Hypertension   . Breast cancer, stage 1 12/04/2011  . Arthritis 12/04/2011  . History of radiation therapy 12/04/2011   Past Surgical History  Procedure Laterality Date  . Abdominal hysterectomy     Family History  Problem Relation Age of Onset  . Heart disease Sister    History  Substance Use Topics  . Smoking status: Never Smoker   . Smokeless tobacco: Never Used  . Alcohol Use: No   OB History    No data available     Review of Systems  Genitourinary: Positive for flank pain.  All other systems reviewed and are negative.     Allergies  Codeine  Home Medications   Prior to Admission medications   Medication Sig Start Date End Date Taking? Authorizing Provider  aspirin 81 MG tablet Take 81  mg by mouth daily.      Historical Provider, MD  Cholecalciferol (VITAMIN D-3) 1000 UNITS CAPS Take 1,000 Units by mouth daily.    Historical Provider, MD  fish oil-omega-3 fatty acids 1000 MG capsule Take 1,200 mg by mouth daily.      Historical Provider, MD  glucosamine-chondroitin 500-400 MG tablet Take 1 tablet by mouth 2 (two) times daily.    Historical Provider, MD  hydrochlorothiazide (HYDRODIURIL) 25 MG tablet Take 25 mg by mouth daily.      Historical Provider, MD  lisinopril (PRINIVIL,ZESTRIL) 10 MG tablet Take 10 mg by mouth daily.      Historical Provider, MD  metoprolol (TOPROL-XL) 100 MG 24 hr tablet Take 100 mg by mouth daily.      Historical Provider, MD  Multiple Vitamin (MULTIVITAMIN) tablet Take 1 tablet by mouth daily.    Historical Provider, MD  sertraline (ZOLOFT) 50 MG tablet Daily. Patient takes 1/2 tablet daily. Only taking 25 mg total a day. 04/28/11   Historical Provider, MD  traMADol (ULTRAM) 50 MG tablet  03/03/14   Historical Provider, MD   BP 146/69 mmHg  Pulse 62  Resp 18  SpO2 99% Physical Exam  Constitutional: She is oriented to person, place, and time. She appears well-developed and well-nourished. No distress.  HENT:  Head: Normocephalic and atraumatic.  Neck: Normal range of motion. Neck supple.  Cardiovascular: Normal rate and regular rhythm.  Exam reveals no gallop  and no friction rub.   No murmur heard. Pulmonary/Chest: Effort normal and breath sounds normal. No respiratory distress. She has no wheezes.  Abdominal: Soft. Bowel sounds are normal. She exhibits no distension. There is no tenderness.  Musculoskeletal: Normal range of motion.  Neurological: She is alert and oriented to person, place, and time.  Skin: Skin is warm and dry. She is not diaphoretic.  Nursing note and vitals reviewed.   ED Course  Procedures (including critical care time) Labs Review Labs Reviewed  URINALYSIS, ROUTINE W REFLEX MICROSCOPIC  BASIC METABOLIC PANEL  CBC  WITH DIFFERENTIAL    Imaging Review No results found.   EKG Interpretation None      MDM   Final diagnoses:  None    CT reveals a 5 mm renal calculus in the proximal left ureter. She is feeling better with pain medication and Zofran. She will be discharged home with Percocet, Zofran, and when necessary follow-up with urology if the stone has not passed in the next 2-3 days.    Veryl Speak, MD 06/13/14 1245

## 2014-06-13 NOTE — ED Notes (Signed)
MD at bedside. EDP DELO PRESENT TO EVALUATE THIS PT

## 2014-06-13 NOTE — Discharge Instructions (Signed)
Percocet as prescribed as needed for pain. Zofran as prescribed as needed for nausea.  Follow-up with [facility] if your stone is not passed in the next 24-48 hours. Call to arrange this appointment.   Kidney Stones Kidney stones (urolithiasis) are deposits that form inside your kidneys. The intense pain is caused by the stone moving through the urinary tract. When the stone moves, the ureter goes into spasm around the stone. The stone is usually passed in the urine.  CAUSES   A disorder that makes certain neck glands produce too much parathyroid hormone (primary hyperparathyroidism).  A buildup of uric acid crystals, similar to gout in your joints.  Narrowing (stricture) of the ureter.  A kidney obstruction present at birth (congenital obstruction).  Previous surgery on the kidney or ureters.  Numerous kidney infections. SYMPTOMS   Feeling sick to your stomach (nauseous).  Throwing up (vomiting).  Blood in the urine (hematuria).  Pain that usually spreads (radiates) to the groin.  Frequency or urgency of urination. DIAGNOSIS   Taking a history and physical exam.  Blood or urine tests.  CT scan.  Occasionally, an examination of the inside of the urinary bladder (cystoscopy) is performed. TREATMENT   Observation.  Increasing your fluid intake.  Extracorporeal shock wave lithotripsy--This is a noninvasive procedure that uses shock waves to break up kidney stones.  Surgery may be needed if you have severe pain or persistent obstruction. There are various surgical procedures. Most of the procedures are performed with the use of small instruments. Only small incisions are needed to accommodate these instruments, so recovery time is minimized. The size, location, and chemical composition are all important variables that will determine the proper choice of action for you. Talk to your health care provider to better understand your situation so that you will minimize  the risk of injury to yourself and your kidney.  HOME CARE INSTRUCTIONS   Drink enough water and fluids to keep your urine clear or pale yellow. This will help you to pass the stone or stone fragments.  Strain all urine through the provided strainer. Keep all particulate matter and stones for your health care provider to see. The stone causing the pain may be as small as a grain of salt. It is very important to use the strainer each and every time you pass your urine. The collection of your stone will allow your health care provider to analyze it and verify that a stone has actually passed. The stone analysis will often identify what you can do to reduce the incidence of recurrences.  Only take over-the-counter or prescription medicines for pain, discomfort, or fever as directed by your health care provider.  Make a follow-up appointment with your health care provider as directed.  Get follow-up X-rays if required. The absence of pain does not always mean that the stone has passed. It may have only stopped moving. If the urine remains completely obstructed, it can cause loss of kidney function or even complete destruction of the kidney. It is your responsibility to make sure X-rays and follow-ups are completed. Ultrasounds of the kidney can show blockages and the status of the kidney. Ultrasounds are not associated with any radiation and can be performed easily in a matter of minutes. SEEK MEDICAL CARE IF:  You experience pain that is progressive and unresponsive to any pain medicine you have been prescribed. SEEK IMMEDIATE MEDICAL CARE IF:   Pain cannot be controlled with the prescribed medicine.  You have a fever  or shaking chills.  The severity or intensity of pain increases over 18 hours and is not relieved by pain medicine.  You develop a new onset of abdominal pain.  You feel faint or pass out.  You are unable to urinate. MAKE SURE YOU:   Understand these instructions.  Will  watch your condition.  Will get help right away if you are not doing well or get worse. Document Released: 05/26/2005 Document Revised: 01/26/2013 Document Reviewed: 10/27/2012 [facility] Patient Information 2015 [place], [place]. This information is not intended to replace advice given to you by your health care provider. Make sure you discuss any questions you have with your health care provider.

## 2014-06-13 NOTE — ED Notes (Signed)
Unable to get temperature.  Pt just drank cold water.

## 2014-06-13 NOTE — ED Notes (Signed)
Patient transported to CT 

## 2014-06-13 NOTE — ED Notes (Signed)
Pt with flank pain since yesterday. Pt states n/v with no fever.  No noticeable blood in urine

## 2014-08-29 ENCOUNTER — Other Ambulatory Visit: Payer: Self-pay | Admitting: Orthopedic Surgery

## 2014-09-13 ENCOUNTER — Ambulatory Visit (HOSPITAL_COMMUNITY)
Admission: RE | Admit: 2014-09-13 | Discharge: 2014-09-13 | Disposition: A | Discharge status: Home / Self Care | Payer: Medicare Other | Source: Ambulatory Visit | Attending: Orthopedic Surgery | Admitting: Orthopedic Surgery

## 2014-09-13 ENCOUNTER — Encounter (HOSPITAL_COMMUNITY): Payer: Self-pay

## 2014-09-13 ENCOUNTER — Encounter (HOSPITAL_COMMUNITY)
Admission: RE | Admit: 2014-09-13 | Discharge: 2014-09-13 | Disposition: A | Discharge status: Home / Self Care | Payer: Medicare Other | Source: Ambulatory Visit | Attending: Orthopedic Surgery | Admitting: Orthopedic Surgery

## 2014-09-13 DIAGNOSIS — Z01818 Encounter for other preprocedural examination: Secondary | ICD-10-CM

## 2014-09-13 DIAGNOSIS — M1611 Unilateral primary osteoarthritis, right hip: Secondary | ICD-10-CM | POA: Diagnosis not present

## 2014-09-13 DIAGNOSIS — M25552 Pain in left hip: Secondary | ICD-10-CM | POA: Diagnosis not present

## 2014-09-13 HISTORY — DX: Other specified postprocedural states: Z98.890

## 2014-09-13 HISTORY — DX: Other specified postprocedural states: R11.2

## 2014-09-13 HISTORY — DX: Personal history of urinary calculi: Z87.442

## 2014-09-13 HISTORY — DX: Depression, unspecified: F32.A

## 2014-09-13 HISTORY — DX: Major depressive disorder, single episode, unspecified: F32.9

## 2014-09-13 HISTORY — DX: Adverse effect of unspecified anesthetic, initial encounter: T41.45XA

## 2014-09-13 HISTORY — DX: Other complications of anesthesia, initial encounter: T88.59XA

## 2014-09-13 LAB — URINALYSIS, ROUTINE W REFLEX MICROSCOPIC
Bilirubin Urine: NEGATIVE
Glucose, UA: NEGATIVE mg/dL
HGB URINE DIPSTICK: NEGATIVE
Ketones, ur: NEGATIVE mg/dL
LEUKOCYTES UA: NEGATIVE
Nitrite: NEGATIVE
PH: 5.5 (ref 5.0–8.0)
Protein, ur: NEGATIVE mg/dL
Specific Gravity, Urine: 1.011 (ref 1.005–1.030)
Urobilinogen, UA: 0.2 mg/dL (ref 0.0–1.0)

## 2014-09-13 LAB — PROTIME-INR
INR: 1.04 (ref 0.00–1.49)
Prothrombin Time: 13.7 seconds (ref 11.6–15.2)

## 2014-09-13 LAB — APTT: aPTT: 32 seconds (ref 24–37)

## 2014-09-13 LAB — BASIC METABOLIC PANEL
Anion gap: 7 (ref 5–15)
BUN: 32 mg/dL — ABNORMAL HIGH (ref 6–23)
CO2: 28 mmol/L (ref 19–32)
Calcium: 9.6 mg/dL (ref 8.4–10.5)
Chloride: 103 mmol/L (ref 96–112)
Creatinine, Ser: 1.06 mg/dL (ref 0.50–1.10)
GFR calc Af Amer: 57 mL/min — ABNORMAL LOW (ref >90)
GFR, EST NON AFRICAN AMERICAN: 49 mL/min — AB (ref >90)
Glucose, Bld: 107 mg/dL — ABNORMAL HIGH (ref 70–99)
Potassium: 3.6 mmol/L (ref 3.5–5.1)
Sodium: 138 mmol/L (ref 135–145)

## 2014-09-13 LAB — TYPE AND SCREEN
ABO/RH(D): O NEG
Antibody Screen: NEGATIVE

## 2014-09-13 LAB — CBC WITH DIFFERENTIAL/PLATELET
BASOS PCT: 1 % (ref 0–1)
Basophils Absolute: 0.1 10*3/uL (ref 0.0–0.1)
Eosinophils Absolute: 0.1 10*3/uL (ref 0.0–0.7)
Eosinophils Relative: 1 % (ref 0–5)
HCT: 39.2 % (ref 36.0–46.0)
Hemoglobin: 13 g/dL (ref 12.0–15.0)
LYMPHS ABS: 1.2 10*3/uL (ref 0.7–4.0)
Lymphocytes Relative: 14 % (ref 12–46)
MCH: 32.2 pg (ref 26.0–34.0)
MCHC: 33.2 g/dL (ref 30.0–36.0)
MCV: 97 fL (ref 78.0–100.0)
Monocytes Absolute: 0.6 10*3/uL (ref 0.1–1.0)
Monocytes Relative: 7 % (ref 3–12)
NEUTROS PCT: 77 % (ref 43–77)
Neutro Abs: 7 10*3/uL (ref 1.7–7.7)
PLATELETS: 224 10*3/uL (ref 150–400)
RBC: 4.04 MIL/uL (ref 3.87–5.11)
RDW: 13.2 % (ref 11.5–15.5)
WBC: 8.9 10*3/uL (ref 4.0–10.5)

## 2014-09-13 LAB — SURGICAL PCR SCREEN
MRSA, PCR: NEGATIVE
STAPHYLOCOCCUS AUREUS: POSITIVE — AB

## 2014-09-13 LAB — ABO/RH: ABO/RH(D): O NEG

## 2014-09-13 NOTE — Progress Notes (Signed)
req'd ekg, office notes from pcp dr Arbie Cookey [facility]

## 2014-09-13 NOTE — Pre-Procedure Instructions (Addendum)
STEHANIE EKSTROM  09/13/2014   Your procedure is scheduled on:  09/25/14  Report to [facility] short stay admitting at 1000 AM.  Call this number if you have problems the morning of surgery: [phone]   Remember:   Do not eat food or drink liquids after midnight.   Take these medicines the morning of surgery with A SIP OF WATER: eye drops, metoprolol, pain med, and zofran if needed    STOP all herbel meds, nsaids (aleve,naproxen,advil,ibuprofen) 5 days prior to surgery starting 09/20/14 including aspirin, vitamins,fish oil, glucosamine       Do not wear jewelry, make-up or nail polish.  Do not wear lotions, powders, or perfumes. You may wear deodorant.  Do not shave 48 hours prior to surgery. Men may shave face and neck.  Do not bring valuables to the hospital.  [facility] is not responsible                  for any belongings or valuables.               Contacts, dentures or bridgework may not be worn into surgery.  Leave suitcase in the car. After surgery it may be brought to your room.  For patients admitted to the hospital, discharge time is determined by your                treatment team.               Patients discharged the day of surgery will not be allowed to drive  home.  Name and phone number of your driver:   Special Instructions:  Special Instructions: [facility] - Preparing for Surgery  Before surgery, you can play an important role.  Because skin is not sterile, your skin needs to be as free of germs as possible.  You can reduce the number of germs on you skin by washing with CHG (chlorahexidine gluconate) soap before surgery.  CHG is an antiseptic cleaner which kills germs and bonds with the skin to continue killing germs even after washing.  Please DO NOT use if you have an allergy to CHG or antibacterial soaps.  If your skin becomes reddened/irritated stop using the CHG and inform your nurse when you arrive at Short Stay.  Do not shave (including legs and  underarms) for at least 48 hours prior to the first CHG shower.  You may shave your face.  Please follow these instructions carefully:   1.  Shower with CHG Soap the night before surgery and the morning of Surgery.  2.  If you choose to wash your hair, wash your hair first as usual with your normal shampoo.  3.  After you shampoo, rinse your hair and body thoroughly to remove the Shampoo.  4.  Use CHG as you would any other liquid soap.  You can apply chg directly  to the skin and wash gently with scrungie or a clean washcloth.  5.  Apply the CHG Soap to your body ONLY FROM THE NECK DOWN.  Do not use on open wounds or open sores.  Avoid contact with your eyes ears, mouth and genitals (private parts).  Wash genitals (private parts)       with your normal soap.  6.  Wash thoroughly, paying special attention to the area where your surgery will be performed.  7.  Thoroughly rinse your body with warm water from the neck down.  8.  DO NOT shower/wash with your  normal soap after using and rinsing off the CHG Soap.  9.  Pat yourself dry with a clean towel.            10.  Wear clean pajamas.            11.  Place clean sheets on your bed the night of your first shower and do not sleep with pets.  Day of Surgery  Do not apply any lotions/deodorants the morning of surgery.  Please wear clean clothes to the hospital/surgery center.   Please read over the following fact sheets that you were given: Pain Booklet, Coughing and Deep Breathing, Blood Transfusion Information, Total Joint Packet, MRSA Information and Surgical Site Infection Prevention

## 2014-09-15 NOTE — Progress Notes (Signed)
Anesthesia Chart Review:  Pt is 80 year old female scheduled for L total hip arthroplasty on 09/25/2014 with Dr. Mayer Camel.   PMH includes: HTN, breast cancer. Never smoker. BMI 21.   Preoperative labs reviewed.    Chest x-ray reviewed. No active cardiopulmonary disease.   EKG: Sinus bradycardia (56 bpm). Lateral infarct, age undetermined. Appears unchanged when compared to tracing from 2011.   Reviewed EKG with Dr. Kalman Shan.  If no changes, I anticipate pt can proceed with surgery as scheduled.   Willeen Cass, FNP-BC [facility] Short Stay Surgical Center/Anesthesiology Phone: [phone] 09/15/2014 3:40 PM

## 2014-09-19 NOTE — H&P (Signed)
TOTAL HIP ADMISSION H&P  Patient is admitted for left total hip arthroplasty.  Subjective:  Chief Complaint: left hip pain  HPI: Sherri Tucker, 80 y.o. female, has a history of pain and functional disability in the left hip(s) due to arthritis and patient has failed non-surgical conservative treatments for greater than 12 weeks to include NSAID's and/or analgesics, flexibility and strengthening excercises, use of assistive devices, weight reduction as appropriate and activity modification.  Onset of symptoms was gradual starting several years ago with gradually worsening course since that time.The patient noted no past surgery on the left hip(s).  Patient currently rates pain in the left hip at 10 out of 10 with activity. Patient has night pain, worsening of pain with activity and weight bearing, trendelenberg gait, pain that interfers with activities of daily living and pain with passive range of motion. Patient has evidence of joint space narrowing and protrusio by imaging studies. This condition presents safety issues increasing the risk of falls.  There is no current active infection.  Patient Active Problem List   Diagnosis Date Noted  . History of breast cancer 04/12/2012  . Breast cancer of upper-outer quadrant of left female breast 12/04/2011  . Arthritis 12/04/2011  . History of radiation therapy 12/04/2011   Past Medical History  Diagnosis Date  . Cancer   . Hemorrhoids   . Colon polyp   . Arthritis   . Hypertension   . Breast cancer, stage 1 12/04/2011  . Arthritis 12/04/2011  . History of radiation therapy 12/04/2011  . Complication of anesthesia   . PONV (postoperative nausea and vomiting)   . Depression   . History of kidney stones     Past Surgical History  Procedure Laterality Date  . Abdominal hysterectomy    . Breast surgery  11    left lumpectomy    No prescriptions prior to admission   Allergies  Allergen Reactions  . Codeine Nausea And Vomiting     History  Substance Use Topics  . Smoking status: Never Smoker   . Smokeless tobacco: Never Used  . Alcohol Use: No    Family History  Problem Relation Age of Onset  . Heart disease Sister      Review of Systems  Constitutional:       Weight changes  HENT: Positive for tinnitus.   Eyes: Negative.   Respiratory: Negative.   Cardiovascular:       Htn  Gastrointestinal: Negative.   Genitourinary:       Kidney stones  Musculoskeletal: Positive for joint pain.  Skin: Negative.   Neurological: Negative.   Endo/Heme/Allergies: Negative.   Psychiatric/Behavioral: The patient is nervous/anxious.     Objective:  Physical Exam  Constitutional: She is oriented to person, place, and time. She appears well-developed and well-nourished.  HENT:  Head: Normocephalic and atraumatic.  Eyes: Pupils are equal, round, and reactive to light.  Neck: Normal range of motion. Neck supple.  Cardiovascular: Intact distal pulses.   Respiratory: Effort normal.  Musculoskeletal: She exhibits tenderness.  The patient has a markedly antalgic gait, limping on the left side.  Internal rotation left hip causes severe pain and is limited to maybe 5 or 10 external rotation is to 30, foot tap is negative.  The contralateral hip has a similar range of motion, but much less discomfort.    Neurological: She is alert and oriented to person, place, and time.  Skin: Skin is warm and dry.  Psychiatric: She has a normal mood  and affect. Her behavior is normal. Judgment and thought content normal.    Vital signs in last 24 hours:    Labs:   Estimated body mass index is 19.10 kg/(m^2) as calculated from the following:   Height as of 03/07/14: 5\' 3"  (1.6 m).   Weight as of 03/07/14: 48.898 kg (107 lb 12.8 oz).   Imaging Review Plain radiographs demonstrate bone-on-bone medial pole arthritis with protrusio on the left, bone-on-bone on the right without protrusio.  Assessment/Plan:  End stage arthritis,  left hip(s)  The patient history, physical examination, clinical judgement of the provider and imaging studies are consistent with end stage degenerative joint disease of the left hip(s) and total hip arthroplasty is deemed medically necessary. The treatment options including medical management, injection therapy, arthroscopy and arthroplasty were discussed at length. The risks and benefits of total hip arthroplasty were presented and reviewed. The risks due to aseptic loosening, infection, stiffness, dislocation/subluxation,  thromboembolic complications and other imponderables were discussed.  The patient acknowledged the explanation, agreed to proceed with the plan and consent was signed. Patient is being admitted for inpatient treatment for surgery, pain control, PT, OT, prophylactic antibiotics, VTE prophylaxis, progressive ambulation and ADL's and discharge planning.The patient is planning to be discharged to skilled nursing facility

## 2014-09-22 NOTE — Progress Notes (Signed)
Spoke with pt, informed that her arrival time is to be 0830 on DOS, she acknowledges understanding .

## 2014-09-24 DIAGNOSIS — M247 Protrusio acetabuli: Secondary | ICD-10-CM | POA: Diagnosis present

## 2014-09-24 MED ORDER — CEFAZOLIN SODIUM-DEXTROSE 2-3 GM-% IV SOLR
2.0000 g | INTRAVENOUS | Status: AC
  Administered 2014-09-25: 2 g via INTRAVENOUS
  Filled 2014-09-24: qty 50

## 2014-09-24 MED ORDER — CHLORHEXIDINE GLUCONATE 4 % EX LIQD
60.0000 mL | Freq: Once | CUTANEOUS | Status: DC
  Filled 2014-09-24: qty 60

## 2014-09-24 MED ORDER — DEXTROSE-NACL 5-0.45 % IV SOLN: INTRAVENOUS | Status: DC

## 2014-09-25 ENCOUNTER — Inpatient Hospital Stay (HOSPITAL_COMMUNITY)
Admission: RE | Admit: 2014-09-25 | Discharge: 2014-09-27 | DRG: 470 | Disposition: A | Discharge status: Home Health | Payer: Medicare Other | Source: Ambulatory Visit | Attending: Orthopedic Surgery | Admitting: Orthopedic Surgery

## 2014-09-25 ENCOUNTER — Encounter (HOSPITAL_COMMUNITY)
Admission: RE | Disposition: A | Discharge status: Home Health | Payer: Medicare Other | Source: Ambulatory Visit | Attending: Orthopedic Surgery

## 2014-09-25 ENCOUNTER — Inpatient Hospital Stay (HOSPITAL_COMMUNITY): Payer: Medicare Other

## 2014-09-25 ENCOUNTER — Inpatient Hospital Stay (HOSPITAL_COMMUNITY): Payer: Medicare Other | Admitting: Emergency Medicine

## 2014-09-25 ENCOUNTER — Inpatient Hospital Stay (HOSPITAL_COMMUNITY): Payer: Medicare Other | Admitting: Anesthesiology

## 2014-09-25 DIAGNOSIS — Z01812 Encounter for preprocedural laboratory examination: Secondary | ICD-10-CM | POA: Diagnosis not present

## 2014-09-25 DIAGNOSIS — M1611 Unilateral primary osteoarthritis, right hip: Principal | ICD-10-CM | POA: Diagnosis present

## 2014-09-25 DIAGNOSIS — I480 Paroxysmal atrial fibrillation: Secondary | ICD-10-CM | POA: Diagnosis not present

## 2014-09-25 DIAGNOSIS — Z79899 Other long term (current) drug therapy: Secondary | ICD-10-CM | POA: Diagnosis not present

## 2014-09-25 DIAGNOSIS — M247 Protrusio acetabuli: Secondary | ICD-10-CM | POA: Diagnosis present

## 2014-09-25 DIAGNOSIS — Z853 Personal history of malignant neoplasm of breast: Secondary | ICD-10-CM | POA: Diagnosis not present

## 2014-09-25 DIAGNOSIS — I4891 Unspecified atrial fibrillation: Secondary | ICD-10-CM | POA: Diagnosis not present

## 2014-09-25 DIAGNOSIS — Z01818 Encounter for other preprocedural examination: Secondary | ICD-10-CM

## 2014-09-25 DIAGNOSIS — M25552 Pain in left hip: Secondary | ICD-10-CM | POA: Diagnosis present

## 2014-09-25 DIAGNOSIS — I10 Essential (primary) hypertension: Secondary | ICD-10-CM | POA: Diagnosis present

## 2014-09-25 DIAGNOSIS — F329 Major depressive disorder, single episode, unspecified: Secondary | ICD-10-CM | POA: Diagnosis present

## 2014-09-25 DIAGNOSIS — Z96642 Presence of left artificial hip joint: Secondary | ICD-10-CM

## 2014-09-25 DIAGNOSIS — Z01810 Encounter for preprocedural cardiovascular examination: Secondary | ICD-10-CM

## 2014-09-25 DIAGNOSIS — Z923 Personal history of irradiation: Secondary | ICD-10-CM

## 2014-09-25 DIAGNOSIS — Z7982 Long term (current) use of aspirin: Secondary | ICD-10-CM | POA: Diagnosis not present

## 2014-09-25 HISTORY — PX: TOTAL HIP ARTHROPLASTY: SHX124

## 2014-09-25 LAB — BASIC METABOLIC PANEL
Anion gap: 7 (ref 5–15)
BUN: 18 mg/dL (ref 6–23)
CO2: 30 mmol/L (ref 19–32)
Calcium: 9.2 mg/dL (ref 8.4–10.5)
Chloride: 104 mmol/L (ref 96–112)
Creatinine, Ser: 0.74 mg/dL (ref 0.50–1.10)
GFR calc Af Amer: >90 mL/min (ref >90)
GFR, EST NON AFRICAN AMERICAN: 80 mL/min — AB (ref >90)
GLUCOSE: 105 mg/dL — AB (ref 70–99)
POTASSIUM: 3.6 mmol/L (ref 3.5–5.1)
Sodium: 141 mmol/L (ref 135–145)

## 2014-09-25 LAB — TSH: TSH: 0.308 u[IU]/mL — AB (ref 0.350–4.500)

## 2014-09-25 SURGERY — ARTHROPLASTY, HIP, TOTAL,POSTERIOR APPROACH
Anesthesia: Monitor Anesthesia Care | Site: Hip | Laterality: Left

## 2014-09-25 MED ORDER — TRANEXAMIC ACID 1000 MG/10ML IV SOLN
2000.0000 mg | INTRAVENOUS | Status: AC
  Administered 2014-09-25: 2000 mg via TOPICAL
  Filled 2014-09-25: qty 20

## 2014-09-25 MED ORDER — PHENYLEPHRINE 40 MCG/ML (10ML) SYRINGE FOR IV PUSH (FOR BLOOD PRESSURE SUPPORT)
PREFILLED_SYRINGE | INTRAVENOUS | Status: AC
  Filled 2014-09-25: qty 10

## 2014-09-25 MED ORDER — HYDROCHLOROTHIAZIDE 25 MG PO TABS
25.0000 mg | ORAL_TABLET | Freq: Every day | ORAL | Status: DC
  Administered 2014-09-25 – 2014-09-27 (×3): 25 mg via ORAL
  Filled 2014-09-25 (×3): qty 1

## 2014-09-25 MED ORDER — BISACODYL 5 MG PO TBEC: 5.0000 mg | DELAYED_RELEASE_TABLET | Freq: Every day | ORAL | Status: DC | PRN

## 2014-09-25 MED ORDER — BUPIVACAINE-EPINEPHRINE 0.5% -1:200000 IJ SOLN
INTRAMUSCULAR | Status: DC | PRN
  Administered 2014-09-25: 30 mL

## 2014-09-25 MED ORDER — PHENYLEPHRINE HCL 10 MG/ML IJ SOLN
10.0000 mg | INTRAVENOUS | Status: DC | PRN
  Administered 2014-09-25: 10 ug/min via INTRAVENOUS

## 2014-09-25 MED ORDER — ESMOLOL HCL 10 MG/ML IV SOLN
INTRAVENOUS | Status: DC | PRN
  Administered 2014-09-25: 20 mg via INTRAVENOUS

## 2014-09-25 MED ORDER — SENNOSIDES-DOCUSATE SODIUM 8.6-50 MG PO TABS: 1.0000 | ORAL_TABLET | Freq: Every evening | ORAL | Status: DC | PRN

## 2014-09-25 MED ORDER — TRANEXAMIC ACID 1000 MG/10ML IV SOLN: 1000.0000 mg | INTRAVENOUS | Status: DC

## 2014-09-25 MED ORDER — METOPROLOL SUCCINATE ER 100 MG PO TB24
100.0000 mg | ORAL_TABLET | Freq: Every day | ORAL | Status: DC
Start: 2014-09-26 — End: 2014-09-27
  Administered 2014-09-26 – 2014-09-27 (×2): 100 mg via ORAL
  Filled 2014-09-25 (×2): qty 1

## 2014-09-25 MED ORDER — BUPIVACAINE-EPINEPHRINE (PF) 0.5% -1:200000 IJ SOLN
INTRAMUSCULAR | Status: AC
  Filled 2014-09-25: qty 30

## 2014-09-25 MED ORDER — FLEET ENEMA 7-19 GM/118ML RE ENEM: 1.0000 | ENEMA | Freq: Once | RECTAL | Status: AC | PRN

## 2014-09-25 MED ORDER — SODIUM CHLORIDE 0.9 % IR SOLN
Status: DC | PRN
  Administered 2014-09-25: 1000 mL

## 2014-09-25 MED ORDER — METOCLOPRAMIDE HCL 5 MG PO TABS: 5.0000 mg | ORAL_TABLET | Freq: Three times a day (TID) | ORAL | Status: DC | PRN

## 2014-09-25 MED ORDER — ONDANSETRON HCL 4 MG/2ML IJ SOLN: 4.0000 mg | Freq: Four times a day (QID) | INTRAMUSCULAR | Status: DC | PRN

## 2014-09-25 MED ORDER — LISINOPRIL 10 MG PO TABS
10.0000 mg | ORAL_TABLET | Freq: Every day | ORAL | Status: DC
  Administered 2014-09-26 – 2014-09-27 (×2): 10 mg via ORAL
  Filled 2014-09-25 (×2): qty 1

## 2014-09-25 MED ORDER — KCL IN DEXTROSE-NACL 20-5-0.45 MEQ/L-%-% IV SOLN
INTRAVENOUS | Status: DC
  Administered 2014-09-25 – 2014-09-26 (×4): via INTRAVENOUS
  Filled 2014-09-25 (×8): qty 1000

## 2014-09-25 MED ORDER — ACETAMINOPHEN 325 MG PO TABS
650.0000 mg | ORAL_TABLET | Freq: Four times a day (QID) | ORAL | Status: DC | PRN
  Administered 2014-09-26: 650 mg via ORAL
  Filled 2014-09-25: qty 2

## 2014-09-25 MED ORDER — DOCUSATE SODIUM 100 MG PO CAPS
100.0000 mg | ORAL_CAPSULE | Freq: Two times a day (BID) | ORAL | Status: DC
  Administered 2014-09-25 – 2014-09-27 (×4): 100 mg via ORAL
  Filled 2014-09-25 (×4): qty 1

## 2014-09-25 MED ORDER — MENTHOL 3 MG MT LOZG: 1.0000 | LOZENGE | OROMUCOSAL | Status: DC | PRN

## 2014-09-25 MED ORDER — OXYCODONE HCL 5 MG PO TABS
5.0000 mg | ORAL_TABLET | ORAL | Status: DC | PRN
  Administered 2014-09-25: 5 mg via ORAL
  Administered 2014-09-26 (×2): 10 mg via ORAL
  Administered 2014-09-27: 5 mg via ORAL
  Filled 2014-09-25 (×3): qty 2
  Filled 2014-09-25: qty 1

## 2014-09-25 MED ORDER — METHOCARBAMOL 1000 MG/10ML IJ SOLN: 500.0000 mg | Freq: Four times a day (QID) | INTRAVENOUS | Status: DC | PRN

## 2014-09-25 MED ORDER — TIZANIDINE HCL 2 MG PO CAPS: 2.0000 mg | ORAL_CAPSULE | Freq: Three times a day (TID) | ORAL | Status: DC

## 2014-09-25 MED ORDER — PROMETHAZINE HCL 25 MG/ML IJ SOLN
6.2500 mg | INTRAMUSCULAR | Status: DC | PRN
Start: 2014-09-25 — End: 2014-09-25

## 2014-09-25 MED ORDER — ONDANSETRON HCL 4 MG/2ML IJ SOLN
INTRAMUSCULAR | Status: AC
  Filled 2014-09-25: qty 2

## 2014-09-25 MED ORDER — FENTANYL CITRATE (PF) 250 MCG/5ML IJ SOLN
INTRAMUSCULAR | Status: AC
  Filled 2014-09-25: qty 5

## 2014-09-25 MED ORDER — PROPOFOL 10 MG/ML IV BOLUS
INTRAVENOUS | Status: AC
  Filled 2014-09-25: qty 20

## 2014-09-25 MED ORDER — PHENOL 1.4 % MT LIQD: 1.0000 | OROMUCOSAL | Status: DC | PRN

## 2014-09-25 MED ORDER — OXYCODONE-ACETAMINOPHEN 5-325 MG PO TABS: 1.0000 | ORAL_TABLET | Freq: Four times a day (QID) | ORAL | Status: DC | PRN

## 2014-09-25 MED ORDER — PROPOFOL INFUSION 10 MG/ML OPTIME
INTRAVENOUS | Status: DC | PRN
  Administered 2014-09-25: 50 ug/kg/min via INTRAVENOUS

## 2014-09-25 MED ORDER — HYDROMORPHONE HCL 1 MG/ML IJ SOLN
INTRAMUSCULAR | Status: AC
  Administered 2014-09-25: 0.5 mg via INTRAVENOUS
  Filled 2014-09-25: qty 1

## 2014-09-25 MED ORDER — ASPIRIN EC 325 MG PO TBEC: 325.0000 mg | DELAYED_RELEASE_TABLET | Freq: Two times a day (BID) | ORAL | Status: DC

## 2014-09-25 MED ORDER — DIPHENHYDRAMINE HCL 12.5 MG/5ML PO ELIX
12.5000 mg | ORAL_SOLUTION | ORAL | Status: DC | PRN
Start: 2014-09-25 — End: 2014-09-27

## 2014-09-25 MED ORDER — ASPIRIN EC 325 MG PO TBEC
325.0000 mg | DELAYED_RELEASE_TABLET | Freq: Every day | ORAL | Status: DC
  Administered 2014-09-26 – 2014-09-27 (×2): 325 mg via ORAL
  Filled 2014-09-25 (×2): qty 1

## 2014-09-25 MED ORDER — ONDANSETRON HCL 4 MG PO TABS
4.0000 mg | ORAL_TABLET | Freq: Four times a day (QID) | ORAL | Status: DC | PRN
  Administered 2014-09-27: 4 mg via ORAL
  Filled 2014-09-25: qty 1

## 2014-09-25 MED ORDER — ESMOLOL HCL 10 MG/ML IV SOLN
INTRAVENOUS | Status: AC
  Filled 2014-09-25: qty 10

## 2014-09-25 MED ORDER — EPHEDRINE SULFATE 50 MG/ML IJ SOLN
INTRAMUSCULAR | Status: DC | PRN
  Administered 2014-09-25 (×3): 5 mg via INTRAVENOUS

## 2014-09-25 MED ORDER — SERTRALINE HCL 25 MG PO TABS
25.0000 mg | ORAL_TABLET | Freq: Every day | ORAL | Status: DC
  Administered 2014-09-25 – 2014-09-26 (×2): 25 mg via ORAL
  Filled 2014-09-25 (×2): qty 1

## 2014-09-25 MED ORDER — HYDROMORPHONE HCL 1 MG/ML IJ SOLN
0.2500 mg | INTRAMUSCULAR | Status: DC | PRN
  Administered 2014-09-25: 0.25 mg via INTRAVENOUS
  Administered 2014-09-25: 0.5 mg via INTRAVENOUS
  Administered 2014-09-25: 0.25 mg via INTRAVENOUS

## 2014-09-25 MED ORDER — LACTATED RINGERS IV SOLN
INTRAVENOUS | Status: DC
  Administered 2014-09-25: 50 mL/h via INTRAVENOUS
  Administered 2014-09-25: 10:00:00 via INTRAVENOUS

## 2014-09-25 MED ORDER — METHOCARBAMOL 500 MG PO TABS
500.0000 mg | ORAL_TABLET | Freq: Four times a day (QID) | ORAL | Status: DC | PRN
  Administered 2014-09-26: 500 mg via ORAL
  Filled 2014-09-25 (×2): qty 1

## 2014-09-25 MED ORDER — ACETAMINOPHEN 650 MG RE SUPP: 650.0000 mg | Freq: Four times a day (QID) | RECTAL | Status: DC | PRN

## 2014-09-25 MED ORDER — BUPIVACAINE IN DEXTROSE 0.75-8.25 % IT SOLN
INTRATHECAL | Status: DC | PRN
  Administered 2014-09-25: 12 mg via INTRATHECAL

## 2014-09-25 MED ORDER — METOCLOPRAMIDE HCL 5 MG/ML IJ SOLN
5.0000 mg | Freq: Three times a day (TID) | INTRAMUSCULAR | Status: DC | PRN
Start: 2014-09-25 — End: 2014-09-27

## 2014-09-25 MED ORDER — HYDROMORPHONE HCL 1 MG/ML IJ SOLN
1.0000 mg | INTRAMUSCULAR | Status: DC | PRN
  Administered 2014-09-25 – 2014-09-27 (×7): 1 mg via INTRAVENOUS
  Filled 2014-09-25 (×7): qty 1

## 2014-09-25 SURGICAL SUPPLY — 59 items
BLADE SAW SGTL 18X1.27X75 (BLADE) ×2 IMPLANT
BLADE SAW SGTL 18X1.27X75MM (BLADE) ×1
BRUSH FEMORAL CANAL (MISCELLANEOUS) IMPLANT
CAPT HIP TOTAL 2 ×2 IMPLANT
COVER BACK TABLE 24X17X13 BIG (DRAPES) IMPLANT
COVER SURGICAL LIGHT HANDLE (MISCELLANEOUS) ×6 IMPLANT
DRAPE IMP U-DRAPE 54X76 (DRAPES) ×3 IMPLANT
DRAPE ORTHO SPLIT 77X108 STRL (DRAPES) ×3
DRAPE PROXIMA HALF (DRAPES) ×3 IMPLANT
DRAPE SURG ORHT 6 SPLT 77X108 (DRAPES) ×1 IMPLANT
DRAPE U-SHAPE 47X51 STRL (DRAPES) ×3 IMPLANT
DRILL BIT 7/64X5 (BIT) ×3 IMPLANT
DRSG AQUACEL AG ADV 3.5X10 (GAUZE/BANDAGES/DRESSINGS) ×3 IMPLANT
DRSG AQUACEL AG ADV 3.5X14 (GAUZE/BANDAGES/DRESSINGS) ×2 IMPLANT
DURAPREP 26ML APPLICATOR (WOUND CARE) ×3 IMPLANT
ELECT BLADE 4.0 EZ CLEAN MEGAD (MISCELLANEOUS)
ELECT REM PT RETURN 9FT ADLT (ELECTROSURGICAL) ×3
ELECTRODE BLDE 4.0 EZ CLN MEGD (MISCELLANEOUS) IMPLANT
ELECTRODE REM PT RTRN 9FT ADLT (ELECTROSURGICAL) ×1 IMPLANT
GLOVE BIO SURGEON STRL SZ7.5 (GLOVE) ×3 IMPLANT
GLOVE BIO SURGEON STRL SZ8.5 (GLOVE) ×6 IMPLANT
GLOVE BIOGEL PI IND STRL 8 (GLOVE) ×2 IMPLANT
GLOVE BIOGEL PI IND STRL 9 (GLOVE) ×1 IMPLANT
GLOVE BIOGEL PI INDICATOR 8 (GLOVE) ×4
GLOVE BIOGEL PI INDICATOR 9 (GLOVE) ×2
GOWN STRL REUS W/ TWL LRG LVL3 (GOWN DISPOSABLE) ×2 IMPLANT
GOWN STRL REUS W/ TWL XL LVL3 (GOWN DISPOSABLE) ×3 IMPLANT
GOWN STRL REUS W/TWL LRG LVL3 (GOWN DISPOSABLE) ×6
GOWN STRL REUS W/TWL XL LVL3 (GOWN DISPOSABLE) ×9
HANDPIECE INTERPULSE COAX TIP (DISPOSABLE)
HOOD PEEL AWAY FACE SHEILD DIS (HOOD) ×6 IMPLANT
KIT BASIN OR (CUSTOM PROCEDURE TRAY) ×3 IMPLANT
KIT ROOM TURNOVER OR (KITS) ×3 IMPLANT
MANIFOLD NEPTUNE II (INSTRUMENTS) ×3 IMPLANT
MARKER SKIN DUAL TIP RULER LAB (MISCELLANEOUS) ×2 IMPLANT
NEEDLE 22X1 1/2 (OR ONLY) (NEEDLE) ×3 IMPLANT
NS IRRIG 1000ML POUR BTL (IV SOLUTION) ×3 IMPLANT
PACK TOTAL JOINT (CUSTOM PROCEDURE TRAY) ×3 IMPLANT
PACK UNIVERSAL I (CUSTOM PROCEDURE TRAY) ×3 IMPLANT
PAD ARMBOARD 7.5X6 YLW CONV (MISCELLANEOUS) ×6 IMPLANT
PASSER SUT SWANSON 36MM LOOP (INSTRUMENTS) ×3 IMPLANT
PRESSURIZER FEMORAL UNIV (MISCELLANEOUS) IMPLANT
SET HNDPC FAN SPRY TIP SCT (DISPOSABLE) IMPLANT
SUT ETHIBOND 2 V 37 (SUTURE) ×3 IMPLANT
SUT VIC AB 0 CTB1 27 (SUTURE) ×3 IMPLANT
SUT VIC AB 1 CTX 36 (SUTURE) ×3
SUT VIC AB 1 CTX36XBRD ANBCTR (SUTURE) ×1 IMPLANT
SUT VIC AB 2-0 CTB1 (SUTURE) ×3 IMPLANT
SUT VIC AB 3-0 SH 27 (SUTURE) ×3
SUT VIC AB 3-0 SH 27X BRD (SUTURE) ×1 IMPLANT
SYR CONTROL 10ML LL (SYRINGE) ×3 IMPLANT
TOWEL OR 17X24 6PK STRL BLUE (TOWEL DISPOSABLE) ×3 IMPLANT
TOWEL OR 17X26 10 PK STRL BLUE (TOWEL DISPOSABLE) ×3 IMPLANT
TOWER CARTRIDGE SMART MIX (DISPOSABLE) IMPLANT
TRAY FOLEY CATH 14FR (SET/KITS/TRAYS/PACK) IMPLANT
TUBE CONNECTING 12'X1/4 (SUCTIONS) ×1
TUBE CONNECTING 12X1/4 (SUCTIONS) ×1 IMPLANT
WATER STERILE IRR 1000ML POUR (IV SOLUTION) ×12 IMPLANT
YANKAUER SUCT BULB TIP NO VENT (SUCTIONS) ×2 IMPLANT

## 2014-09-25 NOTE — Anesthesia Preprocedure Evaluation (Signed)
Anesthesia Evaluation    Reviewed: Allergy & Precautions, NPO status , Patient's Chart, lab work & pertinent test results  History of Anesthesia Complications (+) PONV and history of anesthetic complications  Airway Mallampati: II  TM Distance: >3 FB Neck ROM: Full    Dental  (+) Teeth Intact, Dental Advisory Given   Pulmonary neg pulmonary ROS,    Pulmonary exam normal       Cardiovascular hypertension,     Neuro/Psych negative neurological ROS     GI/Hepatic negative GI ROS, Neg liver ROS,   Endo/Other  negative endocrine ROS  Renal/GU negative Renal ROS     Musculoskeletal   Abdominal   Peds  Hematology   Anesthesia Other Findings   Reproductive/Obstetrics                             Anesthesia Physical Anesthesia Plan  ASA: III  Anesthesia Plan: MAC and Spinal   Post-op Pain Management:    Induction: Intravenous  Airway Management Planned: Simple Face Mask  Additional Equipment:   Intra-op Plan:   Post-operative Plan:   Informed Consent: I have reviewed the patients History and Physical, chart, labs and discussed the procedure including the risks, benefits and alternatives for the proposed anesthesia with the patient or authorized representative who has indicated his/her understanding and acceptance.   Dental advisory given  Plan Discussed with: CRNA, Anesthesiologist and Surgeon  Anesthesia Plan Comments:         Anesthesia Quick Evaluation

## 2014-09-25 NOTE — Interval H&P Note (Signed)
History and Physical Interval Note:  09/25/2014 8:54 AM  Sherri Tucker  has presented today for surgery, with the diagnosis of LEFT HIP OSTEOARTHRITIS WITH PROTRUSIO  The various methods of treatment have been discussed with the patient and family. After consideration of risks, benefits and other options for treatment, the patient has consented to  Procedure(s): TOTAL HIP ARTHROPLASTY (Left) as a surgical intervention .  The patient's history has been reviewed, patient examined, no change in status, stable for surgery.  I have reviewed the patient's chart and labs.  Questions were answered to the patient's satisfaction.     Kerin Salen

## 2014-09-25 NOTE — Anesthesia Procedure Notes (Addendum)
Spinal Patient location during procedure: OR Start time: 09/25/2014 10:04 AM End time: 09/25/2014 10:11 AM Staffing Anesthesiologist: Duane Boston Performed by: anesthesiologist  Preanesthetic Checklist Completed: patient identified, surgical consent, pre-op evaluation, timeout performed, IV checked, risks and benefits discussed and monitors and equipment checked Spinal Block Patient position: sitting Prep: Betadine Patient monitoring: cardiac monitor, continuous pulse ox and blood pressure Approach: midline Location: L2-3 Injection technique: single-shot Needle Needle type: Pencan  Needle gauge: 24 G Needle length: 9 cm Additional Notes Functioning IV was confirmed and monitors were applied. Sterile prep and drape, including hand hygiene and sterile gloves were used. The patient was positioned and the spine was prepped. The skin was anesthetized with lidocaine.  Free flow of clear CSF was obtained prior to injecting local anesthetic into the CSF.  The spinal needle aspirated freely following injection.  The needle was carefully withdrawn.  The patient tolerated the procedure well.   Date/Time: 09/25/2014 10:05 AM Performed by: Izora Gala Pre-anesthesia Checklist: Patient identified, Emergency Drugs available, Suction available and Patient being monitored Patient Re-evaluated:Patient Re-evaluated prior to inductionOxygen Delivery Method: Simple face mask Intubation Type: IV induction Placement Confirmation: positive ETCO2 Comments: Spinal anesthesia.  Natural airway.

## 2014-09-25 NOTE — Care Management Note (Signed)
CARE MANAGEMENT NOTE 09/25/2014  Patient:  Sherri Tucker, Sherri Tucker   Account Number:  [id number]  Date Initiated:  09/25/2014  Documentation initiated by:  Ricki Miller  Subjective/Objective Assessment:   80 yr old female admitted with osteoarthritis of her left hip. Patient underwent a left total hip arthroplasty.     Action/Plan:   PT/OT eval.  Patient was preoperatively setup with [facility]. May need shortterm rehab at [facility]. Case manager will continue to follow.   Anticipated DC Date:     Anticipated DC Plan:    In-house referral  Clinical Social Worker      DC Planning Services  CM consult      Choice offered to / List presented to:             Status of service:  In process, will continue to follow Medicare Important Message given?   (If response is "NO", the following Medicare IM given date fields will be blank) Date Medicare IM given:   Medicare IM given by:   Date Additional Medicare IM given:   Additional Medicare IM given by:    Discharge Disposition:    Per UR Regulation:  Reviewed for med. necessity/level of care/duration of stay

## 2014-09-25 NOTE — Consult Note (Signed)
Patient ID: Sherri Tucker MRN: [id number], DOB/AGE: [birth year]   Admit date: 09/25/2014   Primary Physician: [name], MD Primary Cardiologist: New   Pt. Profile:  80 y/o female s/p left THR being evaluated for perioperative atrial fibrillation  Problem List  Past Medical History  Diagnosis Date  . Cancer   . Hemorrhoids   . Colon polyp   . Arthritis   . Hypertension   . Breast cancer, stage 1 12/04/2011  . Arthritis 12/04/2011  . History of radiation therapy 12/04/2011  . Complication of anesthesia   . PONV (postoperative nausea and vomiting)   . Depression   . History of kidney stones     Past Surgical History  Procedure Laterality Date  . Abdominal hysterectomy    . Breast surgery  11    left lumpectomy     Allergies  Allergies  Allergen Reactions  . Codeine Nausea And Vomiting    HPI  The patient is a 80 y/o female, from [place], being evaluated for perioperative atrial fibrillation after undergoing left THR earlier today by Dr. Mayer Camel for OA. She reports being told about 1 year ago about having an irregular heart beat that was picked up by EKG on routine physical examination. However, this appears to be paroxsymal as she reports that subsequent EKGs have demonstrated normal rhythm w/o known recurrence.  She is on 100 mg of metoprolol daily along with 81 mg of ASA daily. She denies prior history of stroke/TIA and no h/o DM or HLD. She reports PMH significant for treated hypertension and breast cancer of the left breast 4 years ago, s/p radiation and lumpectomy. She denies any history of thyroid disorders. She has never had a stress test or echocardiogram.    Per report, she was noted to go in and out of atrial fibrillation during surgery. However, during post-op recovery in the PACU, she remained in NSR/sinus brady (rate in the mid 50s-60s). She has been transferred 5N and currently is not on telemetry. She denies any symptoms. No chest pain, palpitations,  dyspnea, dizziness, syncope near syncope. No labs on file. BP is stable.   Home Medications  Prior to Admission medications   Medication Sig Start Date End Date Taking? Authorizing Provider  acetaminophen (TYLENOL) 650 MG CR tablet Take 1,300 mg by mouth daily as needed for pain.   Yes Historical Provider, MD  aspirin 81 MG tablet Take 81 mg by mouth daily.     Yes Historical Provider, MD  carboxymethylcellulose (REFRESH PLUS) 0.5 % SOLN Place 1 drop into the right eye daily as needed (runny eyes).   Yes Historical Provider, MD  Cholecalciferol (VITAMIN D-3) 1000 UNITS CAPS Take 1,000 Units by mouth daily.   Yes Historical Provider, MD  fish oil-omega-3 fatty acids 1000 MG capsule Take 1 g by mouth daily.    Yes Historical Provider, MD  glucosamine-chondroitin 500-400 MG tablet Take 1 tablet by mouth 3 (three) times daily.    Yes Historical Provider, MD  hydrochlorothiazide (HYDRODIURIL) 25 MG tablet Take 25 mg by mouth daily.     Yes Historical Provider, MD  lisinopril (PRINIVIL,ZESTRIL) 10 MG tablet Take 10 mg by mouth daily.     Yes Historical Provider, MD  metoprolol (TOPROL-XL) 100 MG 24 hr tablet Take 100 mg by mouth daily.     Yes Historical Provider, MD  sertraline (ZOLOFT) 50 MG tablet Take 25 mg by mouth at bedtime. Patient takes 1/2 tablet daily. Only taking 25 mg total  a day. 04/28/11  Yes Historical Provider, MD  aspirin EC 325 MG tablet Take 1 tablet (325 mg total) by mouth 2 (two) times daily. 09/25/14   Leighton Parody, PA-C  ondansetron (ZOFRAN) 8 MG tablet Take 1 tablet (8 mg total) by mouth every 4 (four) hours as needed for nausea. 06/13/14   Veryl Speak, MD  oxyCODONE-acetaminophen (PERCOCET) 5-325 MG per tablet Take 1-2 tablets by mouth every 6 (six) hours as needed. 09/25/14   Leighton Parody, PA-C  tizanidine (ZANAFLEX) 2 MG capsule Take 1 capsule (2 mg total) by mouth 3 (three) times daily. 09/25/14   Leighton Parody, PA-C  traMADol (ULTRAM) 50 MG tablet Take 100 mg by mouth  every 6 (six) hours as needed for moderate pain.  03/03/14   Historical Provider, MD    Family History  Family History  Problem Relation Age of Onset  . Heart disease Sister     Social History  History   Social History  . Marital Status: Widowed    Spouse Name: [name]  . Number of Children: [name]  . Years of Education: [name]   Occupational History  . Not on file.   Social History Main Topics  . Smoking status: Never Smoker   . Smokeless tobacco: Never Used  . Alcohol Use: No  . Drug Use: No  . Sexual Activity: Not Currently   Other Topics Concern  . Not on file   Social History Narrative     Review of Systems General:  No chills, fever, night sweats or weight changes.  Cardiovascular:  No chest pain, dyspnea on exertion, edema, orthopnea, palpitations, paroxysmal nocturnal dyspnea. Dermatological: No rash, lesions/masses Respiratory: No cough, dyspnea Urologic: No hematuria, dysuria Abdominal:   No nausea, vomiting, diarrhea, bright red blood per rectum, melena, or hematemesis Neurologic:  No visual changes, wkns, changes in mental status. All other systems reviewed and are otherwise negative except as noted above.  Physical Exam  Blood pressure 103/72, pulse 62, temperature 97.6 F (36.4 C), temperature source Oral, resp. rate 12, height 5\' 3"  (1.6 m), weight 117 lb 8 oz (53.298 kg), SpO2 100 %.  General: Pleasant, NAD Psych: Normal affect. Neuro: Alert and oriented X 3. Moves all extremities spontaneously. HEENT: Normal  Neck: Supple without bruits or JVD. Lungs:  Resp regular and unlabored, CTA. Heart: RRR no s3, s4, or murmurs. Abdomen: Soft, non-tender, non-distended, BS + x 4.  Extremities: No clubbing, cyanosis or edema. DP/PT/Radials 2+ and equal bilaterally.  Labs  Troponin (Point of Care Test) No results for input(s): TROPIPOC in the last 72 hours. No results for input(s): CKTOTAL, CKMB, TROPONINI in the last 72 hours. Lab Results  Component Value  Date   WBC 8.9 09/13/2014   HGB 13.0 09/13/2014   HCT 39.2 09/13/2014   MCV 97.0 09/13/2014   PLT 224 09/13/2014   No results for input(s): NA, K, CL, CO2, BUN, CREATININE, CALCIUM, PROT, BILITOT, ALKPHOS, ALT, AST, GLUCOSE in the last 168 hours.  Invalid input(s): LABALBU No results found for: CHOL, HDL, LDLCALC, TRIG No results found for: DDIMER   Radiology/Studies  Dg Chest 2 View  09/13/2014   CLINICAL DATA:  Preop for total hip replacement.  EXAM: CHEST  2 VIEW  COMPARISON:  May 20, 2010.  FINDINGS: The heart size and mediastinal contours are within normal limits. Both lungs are clear. No pneumothorax or pleural effusion is noted. Stable severe compression deformity of lower thoracic vertebral body is noted.  IMPRESSION: No  active cardiopulmonary disease.   Electronically Signed   By: Marijo Conception, M.D.   On: 09/13/2014 15:09   Dg Pelvis Portable  09/25/2014   CLINICAL DATA:  Left hip arthroplasty.  EXAM: PORTABLE PELVIS 1-2 VIEWS  COMPARISON:  None.  FINDINGS: Left total hip arthroplasty without failure or complication. No dislocation. Subtle linear lucency in the region of the ischium likely reflecting artifact secondary to soft tissue emphysema from recent surgery versus less likely a nondisplaced fracture. Attention on follow-up examination is recommended. Postsurgical changes in the soft tissues surrounding the left hip.  Severe osteoarthritis of the right hip with severe axial joint space narrowing.  Left total hip arthroplasty without failure or complication. Subtle linear lucency in the region of the ischium likely reflecting artifact secondary to soft tissue emphysema from recent surgery versus less likely a nondisplaced fracture. Attention on follow-up examination is recommended.   Electronically Signed   By: Kathreen Devoid   On: 09/25/2014 13:41    ECG  Sinus brady 56 bpm   ASSESSMENT AND PLAN  Principal Problem:   Protrusio acetabuli Left   1. Perioperative  Atrial Fibrillation: Transient atrial fibrillation reported by PACU nurse. No telemetry strips available to review. In PACU, she was in NSR. She is currently not on telemetry but cardiac exam reveals RRR. Will order for her to be on telemetry and will monitor overnight. Operative episodes likely transient from anesthesia, however will check TSH and electrolytes. Can also check a 2D echo. Continue PO metoprolol for rate control. Continue ASA for now. Her CHA2DS2 VASc score is at least 4 (HTN, Age>75 (2) and female sex). If documentation of recurrent atrial fibrillation beyond post-op recovery, then she may need to be considered for anticoagulation for stroke prophylaxis. At the same time, she is a high fall risk given recent LE orthopedic surgery with weight bearing limitations and need for physical therapy for gait training. If anticoagulation is to be considered, may need to delay start until she proves that she can ambulate safely.   MD to assess and will provide further recommendations.    Signed, Lyda Jester, PA-C 09/25/2014, 2:57 PM  Patient seen, examined. Available data reviewed. Agree with findings, assessment, and plan as outlined by Lyda Jester, PA-C. The patient is independently interviewed and examined. Lung fields are clear. Jugular venous pressure is normal. There are no carotid bruits. Heart is regular rate and rhythm without murmurs or gallops. There is no pretibial edema present.  The patient's preoperative EKG was reviewed and this shows sinus rhythm. Reportedly there was atrial fibrillation during her surgery. There are no rhythm strips available for review. On exam she appears to be in sinus rhythm.  If the patient does in fact have atrial fibrillation, anticoagulation would be indicated considering her CHADS-Vasc score of 4. I agree with the plan as outlined above. This includes telemetry monitoring while she is here in the hospital. Will repeat a 12-lead EKG tomorrow  morning. Would place an event monitor if there is no atrial fibrillation seen over the next 24 hours. The patient should have an echocardiogram to evaluate left ventricular function and atrial size.  We discussed consideration of anticoagulation. The patient is not very keen on this. We reviewed the pros and cons of warfarin versus direct oral anticoagulant drugs. She will consider options if she does in fact have atrial fibrillation. She does understand the risk of stroke and thromboembolism without anticoagulation.  Sherren Mocha, M.D. 09/25/2014 5:08 PM

## 2014-09-25 NOTE — Discharge Instructions (Signed)

## 2014-09-25 NOTE — Evaluation (Signed)
Physical Therapy Evaluation Patient Details Name: Sherri Tucker MRN: [id number] DOB: Aug 22, 1934 Today's Date: 09/25/2014   History of Present Illness  Pt is a 80 y/o F s/p L THA, post precautions.  Pt's PMH includes PONV, depression, stage I breast cancer, and HTN.   Clinical Impression  Pt is s/p L THA resulting in the deficits listed below (see PT Problem List). Pt ambulated 25 ft out of room this session w/ verbal cues to stand upright.  Pt was limited by fatigue in BLEs.  Will assess stairs at next session if appropriate. Pt will benefit from skilled PT to increase their independence and safety with mobility to allow discharge to the venue listed below.      Follow Up Recommendations Outpatient PT;Supervision for mobility/OOB    Equipment Recommendations  Rolling walker with 5" wheels;3in1 (PT)    Recommendations for Other Services       Precautions / Restrictions Precautions Precautions: Posterior Hip;Fall Precaution Booklet Issued: Yes (comment) Precaution Comments: reviewed post hip precautions Restrictions Weight Bearing Restrictions: Yes LLE Weight Bearing: Weight bearing as tolerated      Mobility  Bed Mobility Overal bed mobility: Modified Independent             General bed mobility comments: use of rails, increased time, cues for sequencing  Transfers Overall transfer level: Needs assistance Equipment used: Rolling walker (2 wheeled) Transfers: Sit to/from Stand Sit to Stand: Min guard         General transfer comment: cues for hand placement and to not pull on RW.    Ambulation/Gait Ambulation/Gait assistance: Min guard Ambulation Distance (Feet): 25 Feet Assistive device: Rolling walker (2 wheeled) Gait Pattern/deviations: Step-to pattern;Decreased stance time - left;Decreased stride length;Antalgic;Trunk flexed   Gait velocity interpretation: Below normal speed for age/gender General Gait Details: Trunk flexed despite verbal cues to stand  upright.  Dec L foot clearnace and very decreased L stance time.  Stairs            Wheelchair Mobility    Modified Rankin (Stroke Patients Only)       Balance Overall balance assessment: Needs assistance Sitting-balance support: No upper extremity supported;Feet supported Sitting balance-Leahy Scale: Fair     Standing balance support: Bilateral upper extremity supported;During functional activity Standing balance-Leahy Scale: Fair                               Pertinent Vitals/Pain Pain Assessment: 0-10 Pain Score: 8  Pain Location: L hip Pain Descriptors / Indicators: Constant;Guarding;Grimacing;Discomfort Pain Intervention(s): Limited activity within patient's tolerance;Monitored during session;Repositioned;RN gave pain meds during session    Sherri Tucker expects to be discharged to:: Private residence (son's house) Living Arrangements: Children (son and daughter) Available Help at Discharge: Family;Available 24 hours/day Type of Home: House Home Access: Stairs to enter Entrance Stairs-Rails: Can reach both Entrance Stairs-Number of Steps: 2 Home Layout: One level Home Equipment: Walker - 4 wheels;Cane - single point Agricultural consultant)      Prior Function Level of Independence: Independent with assistive device(s) (using cane at all times over the past 3 days)               Hand Dominance        Extremity/Trunk Assessment               Lower Extremity Assessment: LLE deficits/detail   LLE Deficits / Details: as expected s/p L THA  Cervical / Trunk  Assessment: Normal  Communication   Communication: No difficulties  Cognition Arousal/Alertness: Awake/alert Behavior During Therapy: WFL for tasks assessed/performed Overall Cognitive Status: Within Functional Limits for tasks assessed                      General Comments      Exercises Total Joint Exercises Ankle Circles/Pumps: AROM;Both;10 reps;Supine Quad  Sets: AROM;Both;10 reps;Supine Heel Slides: AROM;Left;5 reps;Supine      Assessment/Plan    PT Assessment Patient needs continued PT services  PT Diagnosis Difficulty walking;Generalized weakness;Abnormality of gait;Acute pain   PT Problem List Decreased strength;Decreased range of motion;Decreased activity tolerance;Decreased balance;Decreased mobility;Decreased coordination;Decreased knowledge of use of DME;Decreased safety awareness;Decreased knowledge of precautions;Pain  PT Treatment Interventions DME instruction;Gait training;Stair training;Therapeutic activities;Functional mobility training;Therapeutic exercise;Balance training;Neuromuscular re-education;Patient/family education;Modalities   PT Goals (Current goals can be found in the Care Plan section) Acute Rehab PT Goals Patient Stated Goal: to go home PT Goal Formulation: With patient Time For Goal Achievement: 10/02/14 Potential to Achieve Goals: Good    Frequency 7X/week   Barriers to discharge Inaccessible home environment 2 steps to enter home    Co-evaluation               End of Session Equipment Utilized During Treatment: Gait belt Activity Tolerance: Patient limited by fatigue;Patient tolerated treatment well Patient left: in chair;with call bell/phone within reach;with family/visitor present Nurse Communication: Mobility status;Precautions;Weight bearing status         Time: 1630-1650 PT Time Calculation (min) (ACUTE ONLY): 20 min   Charges:   PT Evaluation $Initial PT Evaluation Tier I: 1 Procedure     PT G CodesJoslyn Hy PT, DPT [phone] Pager: [phone] 09/25/2014, 5:08 PM

## 2014-09-25 NOTE — Op Note (Signed)
OPERATIVE REPORT    DATE OF PROCEDURE:  09/25/2014       PREOPERATIVE DIAGNOSIS:  LEFT HIP OSTEOARTHRITIS WITH PROTRUSIO                                                          POSTOPERATIVE DIAGNOSIS:  LEFT HIP OSTEOARTHRITIS WITH PROTRUSIO                                                           PROCEDURE:  L total hip arthroplasty using a 52 mm DePuy Pinnacle  Cup, [employer], 10-degree polyethylene liner index superior  and posterior, a +0 36 mm ceramic head, a 16x11x150x36 SROM stem, 16FL Sleeve   SURGEON: Mariano Doshi J    ASSISTANT:   [employer]  (present throughout entire procedure and necessary for timely completion of the procedure)   ANESTHESIA: spinal BLOOD LOSS: 300 FLUID REPLACEMENT: 1500 crystalloid Antibiotic: 2 gm ancef Tranexamic Acid: 2gm topical COMPLICATIONS: none    INDICATIONS FOR PROCEDURE: A 80 y.o. year-old With  LEFT HIP OSTEOARTHRITIS WITH PROTRUSIO   for 5 years, x-rays show bone-on-bone arthritic changes. Despite conservative measures with observation, anti-inflammatory medicine, narcotics, use of a cane, has severe unremitting pain and can ambulate only a few blocks before resting.  Patient desires elective L total hip arthroplasty to decrease pain and increase function. The risks, benefits, and alternatives were discussed at length including but not limited to the risks of infection, bleeding, nerve injury, stiffness, blood clots, the need for revision surgery, cardiopulmonary complications, among others, and they were willing to proceed. Questions answered     PROCEDURE IN DETAIL: The patient was identified by armband,  received preoperative IV antibiotics in the holding area at [facility], taken to the operating room , appropriate anesthetic monitors  were attached and general endotracheal anesthesia induced. Pt was rolled into the R lateral decubitus position and fixed there with a Stulberg Mark II pelvic clamp.  The L  lower extremity was then prepped and draped  in the usual sterile fashion from the ankle to the hemipelvis. A time-out  procedure was performed. The skin along the lateral hip and thigh  infiltrated with 10 mL of 0.5% Marcaine and epinephrine solution. We  then made a posterolateral approach to the hip. With a #10 blade, a 15 cm  incision was made through the skin and subcutaneous tissue down to the level of the  IT band. Small bleeders were identified and cauterized. The IT band was cut in  line with skin incision exposing the greater trochanter. A Cobra retractor was placed between the gluteus minimus and the superior hip joint capsule, and a spiked Cobra between the quadratus femoris and the inferior hip joint capsule. This isolated the short  external rotators and piriformis tendons. These were tagged with a #2 Ethibond  suture and cut off their insertion on the intertrochanteric crest. The posterior  capsule was then developed into an acetabular-based flap from Posterior Superior off of the acetabulum out over the femoral neck and back posterior inferior to the acetabular rim. This flap was tagged  with two #2 Ethibond sutures and retracted protecting the sciatic nerve. This exposed the arthritic femoral head and osteophytes. The hip was then flexed and internally rotated, dislocating the femoral head and a standard neck cut performed 1 fingerbreadth above the lesser trochanter.  A spiked Cobra was placed in the cotyloid notch and a Hohmann retractor was then used to lever the femur anteriorly off of the anterior pelvic column. A posterior-inferior wing retractor was placed at the junction of the acetabulum and the ischium completing the acetabular exposure.We then removed the peripheral osteophytes and labrum from the acetabulum. We then reamed the acetabulum up to 51 mm with basket reamers obtaining good coverage in all quadrants. We then irrigated with normal  saline solution and hammered into  place a 52 mm pinnacle cup in 45  degrees of abduction and about 25 degrees of anteversion. More  peripheral osteophytes removed and a trial 10-degree liner placed with the  index superior-posterior. The hip was then flexed and internally rotated exposing the  proximal femur, which was entered with the initiating reamer followed by  the axial reamers up to a 11.5 mm full depth and 63mm partial depth. We then conically reamed to 1F to the correct depth for a 42 base neck. The calcar was milled to 1FL. A trial sleeve and stem was inserted in the 20 degrees anteversion, with a +0 63mm trial head. Trial reduction was then performed and excellent stability was noted with at 90 of flexion with 70 of internal rotation and then full extension with maximal external rotation. The hip could not be dislocated in full extension. The knee could easily flex  to about 130 degrees. We also stretched the abductors at this point,  because of the preexisting adductor contractures. All trial components  were then removed. The acetabulum was irrigated out with normal saline  solution. A titanium Apex [place] was then screwed into place  followed by a 10-degree polyethylene liner index superior-posterior. On  the femoral side a  1FL ZTT1 sleeve was hammered into place, followed by a 16x11x36x150 SROM stem in 25 degrees of anteversion. At this point, a +0 36 mm ceramic head was  hammered on the stem. The hip was reduced. We checked our stability  one more time and found it to be excellent. The wound was once again  thoroughly irrigated out with normal saline solution hand lavage. The  capsular flap and short external rotators were repaired back to the  intertrochanteric crest through drill holes with a #2 Ethibond suture.  The IT band was closed with running 1 Vicryl suture. The subcutaneous  tissue with 0 and 2-0 undyed Vicryl suture and the skin with running  3-0 vicryl subcuticular suture. Aquacil dressing  was applied. The patient was then unclamped, rolled supine, awaken extubated and taken to recovery room without difficulty in stable condition.   Sherri Tucker J 09/25/2014, 11:11 AM

## 2014-09-25 NOTE — Anesthesia Postprocedure Evaluation (Signed)
Anesthesia Post Note  Patient: Sherri Tucker  Procedure(s) Performed: Procedure(s) (LRB): TOTAL HIP ARTHROPLASTY (Left)  Anesthesia type: MAC/SAB  Patient location: PACU  Post pain: Pain level controlled  Post assessment: Patient's Cardiovascular Status Stable  Last Vitals:  Filed Vitals:   09/25/14 1245  BP:   Pulse: 56  Temp:   Resp: 18    Post vital signs: Reviewed and stable  Level of consciousness: sedated  Complications: No apparent anesthesia complications

## 2014-09-25 NOTE — Transfer of Care (Signed)
Immediate Anesthesia Transfer of Care Note  Patient: Sherri Tucker  Procedure(s) Performed: Procedure(s): TOTAL HIP ARTHROPLASTY (Left)  Patient Location: PACU  Anesthesia Type:Spinal  Level of Consciousness: awake, alert , oriented and patient cooperative  Airway & Oxygen Therapy: Patient Spontanous Breathing and Patient connected to nasal cannula oxygen  Post-op Assessment: Report given to RN, Post -op Vital signs reviewed and stable and Patient moving all extremities  Post vital signs: Reviewed and stable  Last Vitals:  Filed Vitals:   09/25/14 0842  BP: 152/54  Pulse: 53  Temp: 36.4 C  Resp: 18    Complications: No apparent anesthesia complications

## 2014-09-26 DIAGNOSIS — M247 Protrusio acetabuli: Secondary | ICD-10-CM

## 2014-09-26 LAB — BASIC METABOLIC PANEL
Anion gap: 6 (ref 5–15)
BUN: 9 mg/dL (ref 6–23)
CALCIUM: 8.7 mg/dL (ref 8.4–10.5)
CO2: 30 mmol/L (ref 19–32)
Chloride: 100 mmol/L (ref 96–112)
Creatinine, Ser: 0.75 mg/dL (ref 0.50–1.10)
GFR calc Af Amer: >90 mL/min (ref >90)
GFR, EST NON AFRICAN AMERICAN: 80 mL/min — AB (ref >90)
Glucose, Bld: 164 mg/dL — ABNORMAL HIGH (ref 70–99)
Potassium: 3.6 mmol/L (ref 3.5–5.1)
SODIUM: 136 mmol/L (ref 135–145)

## 2014-09-26 LAB — CBC
HEMATOCRIT: 29 % — AB (ref 36.0–46.0)
Hemoglobin: 9.5 g/dL — ABNORMAL LOW (ref 12.0–15.0)
MCH: 32.3 pg (ref 26.0–34.0)
MCHC: 32.8 g/dL (ref 30.0–36.0)
MCV: 98.6 fL (ref 78.0–100.0)
Platelets: 153 10*3/uL (ref 150–400)
RBC: 2.94 MIL/uL — ABNORMAL LOW (ref 3.87–5.11)
RDW: 13.4 % (ref 11.5–15.5)
WBC: 7.6 10*3/uL (ref 4.0–10.5)

## 2014-09-26 MED ORDER — OXYCODONE HCL ER 10 MG PO T12A: 10.0000 mg | EXTENDED_RELEASE_TABLET | Freq: Two times a day (BID) | ORAL | Status: DC

## 2014-09-26 MED ORDER — OXYCODONE HCL ER 10 MG PO T12A
10.0000 mg | EXTENDED_RELEASE_TABLET | Freq: Two times a day (BID) | ORAL | Status: DC | PRN
  Administered 2014-09-26 – 2014-09-27 (×2): 10 mg via ORAL
  Filled 2014-09-26 (×2): qty 1

## 2014-09-26 NOTE — Progress Notes (Addendum)
Patient ID: Sherri Tucker, female   DOB: 10/05/1934, 80 y.o.   MRN: [id number] PATIENT ID: Sherri Tucker  MRN: [id number]  DOB/AGE:  Jan 18, 1935 / 80 y.o.  1 Day Post-Op Procedure(s) (LRB): TOTAL HIP ARTHROPLASTY (Left)    PROGRESS NOTE Subjective: Patient is alert, oriented, x1 Nausea, no Vomiting, yes passing gas, no Bowel Movement. Taking PO well. Denies SOB, Chest or Calf Pain. Using Incentive Spirometer, PAS in place. Ambulate 49ft Patient reports pain as 3 on 0-10 scale  .    Objective: Vital signs in last 24 hours: Filed Vitals:   09/26/14 0000 09/26/14 0036 09/26/14 0400 09/26/14 0517  BP:  113/45  123/50  Pulse:  65  62  Temp:  99.2 F (37.3 C)  98.7 F (37.1 C)  TempSrc:      Resp: 16 16 16 16   Height:      Weight:      SpO2: 96% 96% 96% 97%      Intake/Output from previous day: I/O last 3 completed shifts: In: 3125 [I.V.:3125] Out: 600 [Urine:600]   Intake/Output this shift:     LABORATORY DATA:  Recent Labs  09/25/14 1604  NA 141  K 3.6  CL 104  CO2 30  BUN 18  CREATININE 0.74  GLUCOSE 105*  CALCIUM 9.2    Examination: Neurologically intact ABD soft Neurovascular intact Sensation intact distally Intact pulses distally Dorsiflexion/Plantar flexion intact Incision: dressing C/D/I No cellulitis present Compartment soft} XR AP&Lat of hip shows well placed\fixed THA Pt in NSR today Assessment:   1 Day Post-Op Procedure(s) (LRB): TOTAL HIP ARTHROPLASTY (Left) ADDITIONAL DIAGNOSIS:  Expected Acute Blood Loss Anemia, HTN  Plan: PT/OT WBAT, THA  posterior precautions  DVT Prophylaxis: SCDx72 hrs, ASA 325 mg BID x 2 weeks  DISCHARGE PLAN: Home  DISCHARGE NEEDS: HHPT, Walker, Commode seat

## 2014-09-26 NOTE — Progress Notes (Addendum)
Patient Name: Sherri Tucker Date of Encounter: 09/26/2014     Principal Problem:   Protrusio acetabuli Left    SUBJECTIVE  Feeling fine. No CP or SOB. Ready to go home. Ortho will discharge tomorrow per patient  CURRENT MEDS . aspirin EC  325 mg Oral Q breakfast  . docusate sodium  100 mg Oral BID  . hydrochlorothiazide  25 mg Oral Daily  . lisinopril  10 mg Oral Daily  . metoprolol succinate  100 mg Oral Daily  . sertraline  25 mg Oral QHS    OBJECTIVE  Filed Vitals:   09/26/14 0000 09/26/14 0036 09/26/14 0400 09/26/14 0517  BP:  113/45  123/50  Pulse:  65  62  Temp:  99.2 F (37.3 C)  98.7 F (37.1 C)  TempSrc:      Resp: 16 16 16 16   Height:      Weight:      SpO2: 96% 96% 96% 97%    Intake/Output Summary (Last 24 hours) at 09/26/14 1109 Last data filed at 09/26/14 0759  Gross per 24 hour  Intake   3485 ml  Output    640 ml  Net   2845 ml   Filed Weights   09/25/14 0842  Weight: 117 lb 8 oz (53.298 kg)    PHYSICAL EXAM  General: Pleasant, NAD. Neuro: Alert and oriented X 3. Moves all extremities spontaneously. Psych: Normal affect. HEENT:  Normal  Neck: Supple without bruits or JVD. Lungs:  Resp regular and unlabored, CTA. Heart: RRR no s3, s4, or murmurs. Abdomen: Soft, non-tender, non-distended, BS + x 4.  Extremities: No clubbing, cyanosis or edema. DP/PT/Radials 2+ and equal bilaterally.  Accessory Clinical Findings  CBC  Recent Labs  09/26/14 0655  WBC 7.6  HGB 9.5*  HCT 29.0*  MCV 98.6  PLT 503   Basic Metabolic Panel  Recent Labs  09/25/14 1604 09/26/14 0655  NA 141 136  K 3.6 3.6  CL 104 100  CO2 30 30  GLUCOSE 105* 164*  BUN 18 9  CREATININE 0.74 0.75  CALCIUM 9.2 8.7   Thyroid Function Tests  Recent Labs  09/25/14 1521  TSH 0.308*    TELE  NSR  Radiology/Studies  Dg Chest 2 View  09/13/2014   CLINICAL DATA:  Preop for total hip replacement.  EXAM: CHEST  2 VIEW  COMPARISON:  May 20, 2010.   FINDINGS: The heart size and mediastinal contours are within normal limits. Both lungs are clear. No pneumothorax or pleural effusion is noted. Stable severe compression deformity of lower thoracic vertebral body is noted.  IMPRESSION: No active cardiopulmonary disease.   Electronically Signed   By: Marijo Conception, M.D.   On: 09/13/2014 15:09   Dg Pelvis Portable  09/25/2014   CLINICAL DATA:  Left hip arthroplasty.  EXAM: PORTABLE PELVIS 1-2 VIEWS  COMPARISON:  None.  FINDINGS: Left total hip arthroplasty without failure or complication. No dislocation. Subtle linear lucency in the region of the ischium likely reflecting artifact secondary to soft tissue emphysema from recent surgery versus less likely a nondisplaced fracture. Attention on follow-up examination is recommended. Postsurgical changes in the soft tissues surrounding the left hip.  Severe osteoarthritis of the right hip with severe axial joint space narrowing.  Left total hip arthroplasty without failure or complication. Subtle linear lucency in the region of the ischium likely reflecting artifact secondary to soft tissue emphysema from recent surgery versus less likely a nondisplaced fracture. Attention on  follow-up examination is recommended.   Electronically Signed   By: Kathreen Devoid   On: 09/25/2014 13:41    ASSESSMENT AND PLAN  80 y/o female with a hx of HTN and breast cancer s/p radiation/lumpectomy who was admitted for left THR on 09/26/14 being evaluated for perioperative atrial fibrillation  1. Perioperative Atrial Fibrillation: Transient atrial fibrillation reported by PACU nurse. No telemetry strips available to review. In PACU, she was in NSR. Operative episodes likely transient from anesthesia. No further documentation of afib. Currently tele with no afib and ECG this AM NSR.  -- TSH mildly low at 3.08. Will check free T3/T4. -- 2D echo pending to evaluate left ventricular function and atrial size.  -- Continue home PO  metoprolol -- Her CHA2DS2 VASc score is at least 4 (HTN, Age>75 (2) and female sex). Continue ASA for now. If documentation of recurrent atrial fibrillation beyond post-op recovery, then she will require anticoagulation for stroke prophylaxis. She discussed consideration of anticoagulation with Dr. Burt Knack. The patient is not very keen on this. She will consider options if she does in fact have atrial fibrillation. She does understand the risk of stroke and thromboembolism without anticoagulation. -- Will place an event monitor if there is no atrial fibrillation seen before discharge. Continue to monitor until she goes home. Repeat ECG tomorrow AM. She is aware she will need to go to the office to pick up a monitor at some point. I will let our office know to call her to have this arranged tomorrow AM if she remains in NSR.    Judy Pimple PA-C  Pager [phone]  Patient seen and examiend  I agree with findings as ntoed by Kathlene November I will review echo.   Continue telemetry while here in hosp She should have an event monitor outpt  Patient says she has to get through rehab first  One thing at a time . WIll make sure she has outpt f/u  Discussed with her importance of documenting any afib in the future.  She did not seem happy.  Dorris Carnes

## 2014-09-26 NOTE — Evaluation (Signed)
Occupational Therapy Evaluation Patient Details Name: Sherri Tucker MRN: [id number] DOB: 1934/10/04 Today's Date: 09/26/2014    History of Present Illness Pt is a 80 y/o F s/p L THA, post precautions.  Pt's PMH includes PONV, depression, stage I breast cancer, and HTN.    Clinical Impression   Pt admitted with the above diagnoses and presents with below problem list. Pt will benefit from continued acute OT to address the below listed deficits and maximize independence with basic ADLs prior to d/c home with 24-hour assistance. PTA pt was mod I using a cane. Currently pt is min guard for LB ADLs, functional mobility, and transfers. Pt educated and performed ADLs as detailed below.     Follow Up Recommendations  Supervision/Assistance - 24 hour;No OT follow up    Equipment Recommendations  Tub/shower seat    Recommendations for Other Services       Precautions / Restrictions Precautions Precautions: Posterior Hip;Fall Precaution Booklet Issued: Yes (comment) Precaution Comments: reviewed; able to state 1/3 precautions (no to cross legs) Restrictions Weight Bearing Restrictions: Yes LLE Weight Bearing: Weight bearing as tolerated      Mobility Bed Mobility Overal bed mobility: Modified Independent             General bed mobility comments: Pt with good BUE strength and technique to perform bed mobility in long sitting position  Transfers Overall transfer level: Needs assistance Equipment used: Rolling walker (2 wheeled) Transfers: Sit to/from Stand Sit to Stand: Min guard         General transfer comment: from recliner. cues for technique    Balance Overall balance assessment: Needs assistance Sitting-balance support: No upper extremity supported;Feet supported Sitting balance-Leahy Scale: Good     Standing balance support: Bilateral upper extremity supported;During functional activity Standing balance-Leahy Scale: Fair                               ADL Overall ADL's : Needs assistance/impaired Eating/Feeding: Set up;Sitting   Grooming: Set up;Sitting   Upper Body Bathing: Set up;Sitting   Lower Body Bathing: Min guard;Sit to/from stand;With adaptive equipment   Upper Body Dressing : Set up;Sitting   Lower Body Dressing: Min guard;Sit to/from stand;With adaptive equipment   Toilet Transfer: Min guard;Ambulation;BSC;RW   Toileting- Water quality scientist and Hygiene: Min guard;Sit to/from stand   Tub/ Shower Transfer: Min guard;Ambulation;Shower seat;Grab bars;Rolling walker   Functional mobility during ADLs: Min guard;Rolling walker General ADL Comments: Educated pt and son on technqiues and AE for safe completion of ADLs with posterior hip precautions. Discussed home setup for ADLs and toilet/shower transfers. Pt ambulated from recliner to EOB and performed bed mobility as detailed above and below.      Vision     Perception     Praxis      Pertinent Vitals/Pain Pain Assessment: 0-10 Pain Score: 5  Pain Location: Lt hip Pain Descriptors / Indicators: Sore Pain Intervention(s): Premedicated before session;Monitored during session;Repositioned     Hand Dominance     Extremity/Trunk Assessment Upper Extremity Assessment Upper Extremity Assessment: Overall WFL for tasks assessed   Lower Extremity Assessment Lower Extremity Assessment: Defer to PT evaluation       Communication Communication Communication: No difficulties   Cognition Arousal/Alertness: Awake/alert Behavior During Therapy: WFL for tasks assessed/performed Overall Cognitive Status: Within Functional Limits for tasks assessed       Memory: Decreased recall of precautions  General Comments       Exercises       Shoulder Instructions      Home Living Family/patient expects to be discharged to:: Other (Comment) (son's house) Living Arrangements: Children Available Help at Discharge: Family;Available 24  hours/day Type of Home: House Home Access: Stairs to enter [employer] of Steps: 2 Entrance Stairs-Rails: Can reach both Home Layout: One level     Bathroom Shower/Tub: Occupational psychologist: Standard Bathroom Accessibility: Yes How Accessible: Accessible via walker Home Equipment: [place] - 4 wheels;Cane - single point;Toilet riser;Grab bars - tub/shower;Hand held shower head          Prior Functioning/Environment Level of Independence: Independent with assistive device(s)        Comments: cane    OT Diagnosis: Acute pain   OT Problem List: Impaired balance (sitting and/or standing);Decreased knowledge of use of DME or AE;Decreased knowledge of precautions;Pain   OT Treatment/Interventions: Self-care/ADL training;Energy conservation;DME and/or AE instruction;Therapeutic activities;Patient/family education;Balance training    OT Goals(Current goals can be found in the care plan section) Acute Rehab OT Goals Patient Stated Goal: to go home OT Goal Formulation: With patient/family Time For Goal Achievement: 10/03/14 Potential to Achieve Goals: Good ADL Goals Pt Will Perform Lower Body Dressing: with modified independence;with adaptive equipment;sit to/from stand Pt Will Transfer to Toilet: with modified independence;ambulating;bedside commode Pt Will Perform Toileting - Clothing Manipulation and hygiene: with modified independence;sit to/from stand Pt Will Perform Tub/Shower Transfer: with modified independence;ambulating;shower seat;rolling walker;grab bars  OT Frequency: Min 2X/week   Barriers to D/C:            Co-evaluation              End of Session Equipment Utilized During Treatment: Gait belt;Rolling walker  Activity Tolerance: Patient tolerated treatment well Patient left: in bed;with call bell/phone within reach;with family/visitor present;with SCD's reapplied   Time: 0950-1009 OT Time Calculation (min): 19 min Charges:  OT  General Charges $OT Visit: 1 Procedure OT Evaluation $Initial OT Evaluation Tier I: 1 Procedure G-Codes:    Hortencia Pilar 19-Oct-2014, 1:22 PM

## 2014-09-26 NOTE — Progress Notes (Signed)
Physical Therapy Treatment Patient Details Name: Sherri Tucker MRN: [id number] DOB: 1934/12/02 Today's Date: 09/26/2014    History of Present Illness Pt is a 80 y/o F s/p L THA, post precautions.  Pt's PMH includes PONV, depression, stage I breast cancer, and HTN.     PT Comments    Pt is making progress towards goals and increasing functional independence. Able to progress ambulation and complete stair training this session. Son was present at end of session and educated on stair technique. Pt plans to D/C tomorrow. Focus on stairs if possible with son present in AM.  Follow Up Recommendations  Outpatient PT;Supervision for mobility/OOB     Equipment Recommendations  Rolling walker with 5" wheels;3in1 (PT)    Recommendations for Other Services       Precautions / Restrictions Precautions Precautions: Posterior Hip;Fall Precaution Comments: reviewed post hip precautions; able to recall 1/3 hip precautions; needed assistance to recall hip rotation and adduction precautions Restrictions LLE Weight Bearing: Weight bearing as tolerated    Mobility  Bed Mobility Overal bed mobility: Modified Independent                Transfers Overall transfer level: Needs assistance Equipment used: Rolling walker (2 wheeled) Transfers: Sit to/from Stand Sit to Stand: Min guard         General transfer comment: Min guard for safety. Cues for hand placement and position of LLE.  Ambulation/Gait Ambulation/Gait assistance: Min guard Ambulation Distance (Feet): 200 Feet Assistive device: Rolling walker (2 wheeled) Gait Pattern/deviations: Step-through pattern;Decreased stance time - left;Trunk flexed   Gait velocity interpretation: Below normal speed for age/gender General Gait Details: Cues for upright posture/forward head and to relax shoulders.   Stairs Stairs: Yes Stairs assistance: Min guard Stair Management: Two rails;Step to pattern;Forwards Number of Stairs:  3 General stair comments: Cues for proper sequencing and technique. Min guard for safety.  Wheelchair Mobility    Modified Rankin (Stroke Patients Only)       Balance                                    Cognition Arousal/Alertness: Awake/alert Behavior During Therapy: WFL for tasks assessed/performed Overall Cognitive Status: Within Functional Limits for tasks assessed                      Exercises Total Joint Exercises Quad Sets: AROM;Left;10 reps Gluteal Sets: AROM;Both;10 reps Heel Slides: AAROM;Left;10 reps Hip ABduction/ADduction: AAROM;Left;10 reps    General Comments        Pertinent Vitals/Pain Pain Assessment: 0-10 Pain Score: 7  Pain Location: L hip Pain Descriptors / Indicators: Sore Pain Intervention(s): Monitored during session    Home Living                      Prior Function            PT Goals (current goals can now be found in the care plan section) Progress towards PT goals: Progressing toward goals    Frequency  7X/week    PT Plan Current plan remains appropriate    Co-evaluation             End of Session Equipment Utilized During Treatment: Gait belt Activity Tolerance: Patient tolerated treatment well Patient left: in bed;with call bell/phone within reach;with family/visitor present     Time: 1131-1202 PT Time Calculation (min) (ACUTE ONLY):  31 min  Charges:                       G CodesRubye Oaks, [place] 09/26/2014, 1:06 PM

## 2014-09-26 NOTE — Progress Notes (Signed)
Physical Therapy Treatment Patient Details Name: JULIENE KIRSH MRN: [id number] DOB: 06/04/1935 Today's Date: 09/26/2014    History of Present Illness Pt is a 80 y/o F s/p L THA, post precautions.  Pt's PMH includes PONV, depression, stage I breast cancer, and HTN.     PT Comments    Pt is making progress towards goals and increasing functional independence. Focus on progressive ambulation and stair training this afternoon. Continue with POC.  Follow Up Recommendations  Outpatient PT;Supervision for mobility/OOB     Equipment Recommendations  Rolling walker with 5" wheels;3in1 (PT)    Recommendations for Other Services       Precautions / Restrictions Precautions Precautions: Posterior Hip;Fall Precaution Comments: reviewed post hip precautions; able to recall 1/3 hip precautions; needed assistance to recall hip rotation and adduction precautions Restrictions LLE Weight Bearing: Weight bearing as tolerated    Mobility  Bed Mobility               General bed mobility comments: Pt in chair before and after.  Transfers Overall transfer level: Needs assistance Equipment used: Rolling walker (2 wheeled) Transfers: Sit to/from Stand Sit to Stand: Min guard         General transfer comment: Min guard for safety. Cues for hand placement and position of LLE.  Ambulation/Gait Ambulation/Gait assistance: Min guard Ambulation Distance (Feet): 80 Feet Assistive device: Rolling walker (2 wheeled) Gait Pattern/deviations: Step-through pattern;Decreased stance time - left;Trunk flexed   Gait velocity interpretation: Below normal speed for age/gender General Gait Details: Cues for upright posture/forward head and to relax shoulders.   Stairs            Wheelchair Mobility    Modified Rankin (Stroke Patients Only)       Balance                                    Cognition Arousal/Alertness: Awake/alert Behavior During Therapy: WFL for  tasks assessed/performed Overall Cognitive Status: Within Functional Limits for tasks assessed                      Exercises Total Joint Exercises Quad Sets: AROM;Left;10 reps Gluteal Sets: AROM;Both;10 reps Heel Slides: AAROM;Left;10 reps Hip ABduction/ADduction: AAROM;Left;10 reps Long Arc Quad: AROM;Left;10 reps    General Comments        Pertinent Vitals/Pain Pain Assessment: 0-10 Pain Score: 5  Pain Location: L hip Pain Descriptors / Indicators: Sore Pain Intervention(s): Monitored during session;Ice applied    Home Living                      Prior Function            PT Goals (current goals can now be found in the care plan section) Progress towards PT goals: Progressing toward goals    Frequency  7X/week    PT Plan Current plan remains appropriate    Co-evaluation             End of Session Equipment Utilized During Treatment: Gait belt Activity Tolerance: Patient tolerated treatment well Patient left: in chair;with call bell/phone within reach;with family/visitor present     Time: 5852-7782 PT Time Calculation (min) (ACUTE ONLY): 23 min  Charges:                       G Codes:  Rubye Oaks, [place] 09/26/2014, 8:24 AM

## 2014-09-27 ENCOUNTER — Encounter (HOSPITAL_COMMUNITY): Payer: Self-pay | Admitting: Orthopedic Surgery

## 2014-09-27 LAB — CBC
HCT: 31.1 % — ABNORMAL LOW (ref 36.0–46.0)
HEMOGLOBIN: 10.1 g/dL — AB (ref 12.0–15.0)
MCH: 31.9 pg (ref 26.0–34.0)
MCHC: 32.5 g/dL (ref 30.0–36.0)
MCV: 98.1 fL (ref 78.0–100.0)
PLATELETS: 158 10*3/uL (ref 150–400)
RBC: 3.17 MIL/uL — ABNORMAL LOW (ref 3.87–5.11)
RDW: 13.2 % (ref 11.5–15.5)
WBC: 7.8 10*3/uL (ref 4.0–10.5)

## 2014-09-27 LAB — T3, FREE: T3 FREE: 2.7 pg/mL (ref 2.0–4.4)

## 2014-09-27 LAB — T4, FREE: Free T4: 1.32 ng/dL (ref 0.80–1.80)

## 2014-09-27 MED ORDER — ASPIRIN EC 325 MG PO TBEC: 325.0000 mg | DELAYED_RELEASE_TABLET | Freq: Two times a day (BID) | ORAL | Status: DC

## 2014-09-27 MED ORDER — TIZANIDINE HCL 2 MG PO CAPS
2.0000 mg | ORAL_CAPSULE | Freq: Three times a day (TID) | ORAL | Status: DC
Start: 2014-09-27 — End: 2015-03-05

## 2014-09-27 MED ORDER — OXYCODONE-ACETAMINOPHEN 5-325 MG PO TABS: 1.0000 | ORAL_TABLET | Freq: Four times a day (QID) | ORAL | Status: DC | PRN

## 2014-09-27 NOTE — Discharge Summary (Signed)
Patient ID: Sherri Tucker MRN: [id number] DOB/AGE: [birth year] 80 y.o.  Admit date: 09/25/2014 Discharge date: 09/27/2014  Admission Diagnoses:  Principal Problem:   Protrusio acetabuli Left   Discharge Diagnoses:  Same  Past Medical History  Diagnosis Date  . Cancer   . Hemorrhoids   . Colon polyp   . Arthritis   . Hypertension   . Breast cancer, stage 1 12/04/2011  . Arthritis 12/04/2011  . History of radiation therapy 12/04/2011  . Complication of anesthesia   . PONV (postoperative nausea and vomiting)   . Depression   . History of kidney stones     Surgeries: Procedure(s): TOTAL HIP ARTHROPLASTY on 09/25/2014   Consultants: Treatment Team:  Rounding Lbcardiology, MD  Discharged Condition: Improved  Hospital Course: Sherri Tucker is an 80 y.o. female who was admitted 09/25/2014 for operative treatment ofProtrusio acetabuli. Patient has severe unremitting pain that affects sleep, daily activities, and work/hobbies. After pre-op clearance the patient was taken to the operating room on 09/25/2014 and underwent  Procedure(s): TOTAL HIP ARTHROPLASTY.    Patient was given perioperative antibiotics: Anti-infectives    Start     Dose/Rate Route Frequency Ordered Stop   09/25/14 0600  ceFAZolin (ANCEF) IVPB 2 g/50 mL premix     2 g 100 mL/hr over 30 Minutes Intravenous On call to O.R. 09/24/14 1251 09/25/14 1028       Patient was given sequential compression devices, early ambulation, and chemoprophylaxis to prevent DVT.  Patient benefited maximally from hospital stay and there were no complications.    Recent vital signs: Patient Vitals for the past 24 hrs:  BP Temp Pulse Resp SpO2  09/27/14 0553 (!) 114/47 mmHg 98 F (36.7 C) 63 18 96 %  09/27/14 0327 - - - 18 98 %  09/26/14 2341 - - - 18 98 %  09/26/14 2024 (!) 118/50 mmHg 97.6 F (36.4 C) 66 18 98 %  09/26/14 2000 - - - 18 98 %  09/26/14 1240 (!) 125/50 mmHg 98.6 F (37 C) 72 18 98 %     Recent laboratory  studies:  Recent Labs  09/25/14 1604 09/26/14 0655 09/27/14 0532  WBC  --  7.6 7.8  HGB  --  9.5* 10.1*  HCT  --  29.0* 31.1*  PLT  --  153 158  NA 141 136  --   K 3.6 3.6  --   CL 104 100  --   CO2 30 30  --   BUN 18 9  --   CREATININE 0.74 0.75  --   GLUCOSE 105* 164*  --   CALCIUM 9.2 8.7  --      Discharge Medications:     Medication List    STOP taking these medications        aspirin 81 MG tablet  Replaced by:  aspirin EC 325 MG tablet      TAKE these medications        acetaminophen 650 MG CR tablet  Commonly known as:  TYLENOL  Take 1,300 mg by mouth daily as needed for pain.     aspirin EC 325 MG tablet  Take 1 tablet (325 mg total) by mouth 2 (two) times daily.     carboxymethylcellulose 0.5 % Soln  Commonly known as:  REFRESH PLUS  Place 1 drop into the right eye daily as needed (runny eyes).     fish oil-omega-3 fatty acids 1000 MG capsule  Take 1 g by mouth daily.  glucosamine-chondroitin 500-400 MG tablet  Take 1 tablet by mouth 3 (three) times daily.     hydrochlorothiazide 25 MG tablet  Commonly known as:  HYDRODIURIL  Take 25 mg by mouth daily.     lisinopril 10 MG tablet  Commonly known as:  PRINIVIL,ZESTRIL  Take 10 mg by mouth daily.     metoprolol succinate 100 MG 24 hr tablet  Commonly known as:  TOPROL-XL  Take 100 mg by mouth daily.     ondansetron 8 MG tablet  Commonly known as:  ZOFRAN  Take 1 tablet (8 mg total) by mouth every 4 (four) hours as needed for nausea.     oxyCODONE-acetaminophen 5-325 MG per tablet  Commonly known as:  PERCOCET  Take 1-2 tablets by mouth every 6 (six) hours as needed.     sertraline 50 MG tablet  Commonly known as:  ZOLOFT  Take 25 mg by mouth at bedtime. Patient takes 1/2 tablet daily. Only taking 25 mg total a day.     tizanidine 2 MG capsule  Commonly known as:  ZANAFLEX  Take 1 capsule (2 mg total) by mouth 3 (three) times daily.     traMADol 50 MG tablet  Commonly known as:   ULTRAM  Take 100 mg by mouth every 6 (six) hours as needed for moderate pain.     Vitamin D-3 1000 UNITS Caps  Take 1,000 Units by mouth daily.        Diagnostic Studies: Dg Chest 2 View  09/13/2014   CLINICAL DATA:  Preop for total hip replacement.  EXAM: CHEST  2 VIEW  COMPARISON:  May 20, 2010.  FINDINGS: The heart size and mediastinal contours are within normal limits. Both lungs are clear. No pneumothorax or pleural effusion is noted. Stable severe compression deformity of lower thoracic vertebral body is noted.  IMPRESSION: No active cardiopulmonary disease.   Electronically Signed   By: Marijo Conception, M.D.   On: 09/13/2014 15:09   Dg Pelvis Portable  09/25/2014   CLINICAL DATA:  Left hip arthroplasty.  EXAM: PORTABLE PELVIS 1-2 VIEWS  COMPARISON:  None.  FINDINGS: Left total hip arthroplasty without failure or complication. No dislocation. Subtle linear lucency in the region of the ischium likely reflecting artifact secondary to soft tissue emphysema from recent surgery versus less likely a nondisplaced fracture. Attention on follow-up examination is recommended. Postsurgical changes in the soft tissues surrounding the left hip.  Severe osteoarthritis of the right hip with severe axial joint space narrowing.  Left total hip arthroplasty without failure or complication. Subtle linear lucency in the region of the ischium likely reflecting artifact secondary to soft tissue emphysema from recent surgery versus less likely a nondisplaced fracture. Attention on follow-up examination is recommended.   Electronically Signed   By: Kathreen Devoid   On: 09/25/2014 13:41    Disposition: 01-Home or Self Care      Discharge Instructions    Call MD / Call 911    Complete by:  As directed   If you experience chest pain or shortness of breath, CALL 911 and be transported to the hospital emergency room.  If you develope a fever above 101 F, pus (white drainage) or increased drainage or redness at the  wound, or calf pain, call your surgeon's office.     Constipation Prevention    Complete by:  As directed   Drink plenty of fluids.  Prune juice may be helpful.  You may use a stool softener,  such as Colace (over the counter) 100 mg twice a day.  Use MiraLax (over the counter) for constipation as needed.     Diet - low sodium heart healthy    Complete by:  As directed      Discharge instructions    Complete by:  As directed   Follow up in office with Dr. Mayer Camel in 2 weeks.     Driving restrictions    Complete by:  As directed   No driving for 2 weeks     Follow the hip precautions as taught in Physical Therapy    Complete by:  As directed      Increase activity slowly as tolerated    Complete by:  As directed      Patient may shower    Complete by:  As directed   You may shower without a dressing once there is no drainage.  Do not wash over the wound.  If drainage remains, cover wound with plastic wrap and then shower.           Follow-up Information    Follow up with Kerin Salen, MD In 2 weeks.   Specialty:  Orthopedic Surgery   Contact information:   [place] [address] [phone]        Signed: Theodosia Quay 09/27/2014, 7:41 AM

## 2014-09-27 NOTE — Progress Notes (Signed)
Occupational Therapy Treatment Patient Details Name: JIRAH RIDER MRN: [id number] DOB: 15-Dec-1934 Today's Date: 09/27/2014    History of present illness Pt is a 80 y/o F s/p L THA, post precautions.  Pt's PMH includes PONV, depression, stage I breast cancer, and HTN.    OT comments  Pt progressing towards acute OT goals. ADLs completed as detailed below.  Follow Up Recommendations  Supervision/Assistance - 24 hour;No OT follow up    Equipment Recommendations  Tub/shower seat    Recommendations for Other Services      Precautions / Restrictions Precautions Precautions: Posterior Hip;Fall Precaution Booklet Issued: Yes (comment) Precaution Comments: reviewed Restrictions Weight Bearing Restrictions: Yes LLE Weight Bearing: Weight bearing as tolerated       Mobility Bed Mobility                  Transfers Overall transfer level: Needs assistance Equipment used: Rolling walker (2 wheeled) Transfers: Sit to/from Stand Sit to Stand: Min guard         General transfer comment: for safety, cues for technique    Balance                                   ADL                           Toilet Transfer: Min guard;Ambulation;RW (3n1 over toilet)   Toileting- Clothing Manipulation and Hygiene: Min guard;Sit to/from stand       Functional mobility during ADLs: Min guard;Rolling walker General ADL Comments: Pt completed in-room ambulation and toilet transfer at min guard level with cues provided for technique forpivoting and sit<>stands to maintain precautions. Also educated son on these techniques to facilitate carryover at home.Discussed technique for getting intoand out of car.       Vision                     Perception     Praxis      Cognition   Behavior During Therapy: WFL for tasks assessed/performed Overall Cognitive Status: Within Functional Limits for tasks assessed                        Extremity/Trunk Assessment               Exercises     Shoulder Instructions       General Comments      Pertinent Vitals/ Pain       Pain Assessment: 0-10 Pain Score: 5  Pain Location: rt hip Pain Intervention(s): Monitored during session  Home Living                                          Prior Functioning/Environment              Frequency Min 2X/week     Progress Toward Goals  OT Goals(current goals can now be found in the care plan section)  Progress towards OT goals: Progressing toward goals  Acute Rehab OT Goals Patient Stated Goal: to go home OT Goal Formulation: With patient/family Time For Goal Achievement: 10/03/14 Potential to Achieve Goals: Good ADL Goals Pt Will Perform Lower Body Dressing: with modified independence;with adaptive equipment;sit to/from stand Pt Will Transfer to  Toilet: with modified independence;ambulating;bedside commode Pt Will Perform Toileting - Clothing Manipulation and hygiene: with modified independence;sit to/from stand Pt Will Perform Tub/Shower Transfer: with modified independence;ambulating;shower seat;rolling walker;grab bars  Plan Discharge plan remains appropriate    Co-evaluation                 End of Session Equipment Utilized During Treatment: Gait belt;Rolling walker   Activity Tolerance Patient tolerated treatment well   Patient Left in bed;with call bell/phone within reach;with family/visitor present;Other (comment) (EOB with transport services for d/c)   Nurse Communication          Time: 4734-0370 OT Time Calculation (min): 11 min  Charges: OT General Charges $OT Visit: 1 Procedure OT Treatments $Self Care/Home Management : 8-22 mins  Hortencia Pilar 09/27/2014, 2:38 PM

## 2014-09-27 NOTE — Progress Notes (Signed)
Patient Name: Sherri Tucker Date of Encounter: 09/27/2014     Principal Problem:   Protrusio acetabuli Left    SUBJECTIVE Ready to go home. Understands we did document about 6 seconds afib on tele. Feeling fine and will follow up as outpatient.   CURRENT MEDS . aspirin EC  325 mg Oral Q breakfast  . docusate sodium  100 mg Oral BID  . hydrochlorothiazide  25 mg Oral Daily  . lisinopril  10 mg Oral Daily  . metoprolol succinate  100 mg Oral Daily  . sertraline  25 mg Oral QHS    OBJECTIVE  Filed Vitals:   09/26/14 2024 09/26/14 2341 09/27/14 0327 09/27/14 0553  BP: 118/50   114/47  Pulse: 66   63  Temp: 97.6 F (36.4 C)   98 F (36.7 C)  TempSrc:      Resp: 18 18 18 18   Height:      Weight:      SpO2: 98% 98% 98% 96%    Intake/Output Summary (Last 24 hours) at 09/27/14 0812 Last data filed at 09/26/14 2216  Gross per 24 hour  Intake    720 ml  Output      0 ml  Net    720 ml   Filed Weights   09/25/14 0842  Weight: 117 lb 8 oz (53.298 kg)    PHYSICAL EXAM  General: Pleasant, NAD. Neuro: Alert and oriented X 3. Moves all extremities spontaneously. Psych: Normal affect. HEENT:  Normal  Neck: Supple without bruits or JVD. Lungs:  Resp regular and unlabored, CTA. Heart: RRR no s3, s4, or murmurs. Abdomen: Soft, non-tender, non-distended, BS + x 4.  Extremities: No clubbing, cyanosis or edema. DP/PT/Radials 2+ and equal bilaterally.  Accessory Clinical Findings  CBC  Recent Labs  09/26/14 0655 09/27/14 0532  WBC 7.6 7.8  HGB 9.5* 10.1*  HCT 29.0* 31.1*  MCV 98.6 98.1  PLT 153 509   Basic Metabolic Panel  Recent Labs  09/25/14 1604 09/26/14 0655  NA 141 136  K 3.6 3.6  CL 104 100  CO2 30 30  GLUCOSE 105* 164*  BUN 18 9  CREATININE 0.74 0.75  CALCIUM 9.2 8.7   Thyroid Function Tests  Recent Labs  09/25/14 1521  TSH 0.308*    TELE  NSR w/ about 6 seconds afib noted on tele.   Radiology/Studies  Dg Chest 2  View  09/13/2014   CLINICAL DATA:  Preop for total hip replacement.  EXAM: CHEST  2 VIEW  COMPARISON:  May 20, 2010.  FINDINGS: The heart size and mediastinal contours are within normal limits. Both lungs are clear. No pneumothorax or pleural effusion is noted. Stable severe compression deformity of lower thoracic vertebral body is noted.  IMPRESSION: No active cardiopulmonary disease.   Electronically Signed   By: Marijo Conception, M.D.   On: 09/13/2014 15:09   Dg Pelvis Portable  09/25/2014   CLINICAL DATA:  Left hip arthroplasty.  EXAM: PORTABLE PELVIS 1-2 VIEWS  COMPARISON:  None.  FINDINGS: Left total hip arthroplasty without failure or complication. No dislocation. Subtle linear lucency in the region of the ischium likely reflecting artifact secondary to soft tissue emphysema from recent surgery versus less likely a nondisplaced fracture. Attention on follow-up examination is recommended. Postsurgical changes in the soft tissues surrounding the left hip.  Severe osteoarthritis of the right hip with severe axial joint space narrowing.  Left total hip arthroplasty without failure or complication. Subtle linear  lucency in the region of the ischium likely reflecting artifact secondary to soft tissue emphysema from recent surgery versus less likely a nondisplaced fracture. Attention on follow-up examination is recommended.   Electronically Signed   By: Kathreen Devoid   On: 09/25/2014 13:41    ASSESSMENT AND PLAN  80 y/o female with a hx of HTN and breast cancer s/p radiation/lumpectomy who was admitted for left THR on 09/26/14 being evaluated for perioperative atrial fibrillation  1. Perioperative Atrial Fibrillation: Transient atrial fibrillation reported by PACU nurse. No telemetry strips available to review. She was placed on tele and remained in NSR the rest of her admission. However, this AM it was noted that she had about 6 seconds afib on tele.  -- TSH mildly low at 3.08. Free T4 normal.  -- 2D  echo was ordered to evaluate left ventricular function and atrial size. However, patient refused this because she was not warned that we had ordered this test. Can do as an outpatient. -- Continue home PO metoprolol -- Her CHA2DS2 VASc score is at least 4 (HTN, Age>75 (2) and female sex). She will require anticoagulation for stroke prophylaxis. She had a long discussion with Dr. Burt Knack and does understand the risk of stroke and thromboembolism without anticoagulation. She is very adamant that she needs to leave the hospital and cannot "deal with this right now." She would like to recover from her hip surgery prior to talking about treatment of afib. She has agreed to follow up in our office in 3 weeks to talk about options for long term AC. She will not take coumadin but is open to a NOAC. I have arranged follow up with Richardson Dopp PA-C in our office in 3 weeks.   Signed, Eileen Stanford PA-C  Pager [phone]  [place] seen   I agree with findings of K Thompson.  She had short burst of PAT/Afib  I would ount commit to anticoag at present given this and fact that she does not want it.   Important that she follow up in cardiology.    Dorris Carnes

## 2014-09-27 NOTE — Care Management Note (Signed)
CARE MANAGEMENT NOTE 09/27/2014  Patient:  Sherri Tucker, Sherri Tucker   Account Number:  [id number]  Date Initiated:  09/25/2014  Documentation initiated by:  Ricki Miller  Subjective/Objective Assessment:   80 yr old female admitted with osteoarthritis of her left hip. Patient underwent a left total hip arthroplasty.     Action/Plan:   PT/OT eval.  Patient was preoperatively setup with [facility]. No changes. Patient will have assistance at home.   Anticipated DC Date:  09/27/2014   Anticipated DC Plan:    In-house referral  Clinical Social Worker      DC Planning Services  CM consult      [facility] Choice  HOME HEALTH   Choice offered to / List presented to:  C-1 Patient   DME arranged  [facility]  3-N-1      DME agency  [employer]     [place] arranged  HH-2 PT      [place]   Status of service:  In process, will continue to follow Medicare Important Message given?  NA - LOS <3 / Initial given by admissions (If response is "NO", the following Medicare IM given date fields will be blank) Date Medicare IM given:   Medicare IM given by:   Date Additional Medicare IM given:   Additional Medicare IM given by:    Discharge Disposition:  [place]  Per UR Regulation:  Reviewed for med. necessity/level of care/duration of stay  If discussed at [place] of Stay Meetings, dates discussed:    Comments:  09/27/14 10:00 am Ricki Miller, RN BSN Case Manager Case manager faxed home health orders , op note and discharge-summary to [facility] with [facility], Phone [phone].

## 2014-09-27 NOTE — Progress Notes (Signed)
Physical Therapy Treatment Patient Details Name: Sherri Tucker MRN: [id number] DOB: [birth year] Today's Date: 09/27/2014    History of Present Illness Pt is a 80 y/o F s/p L THA, post precautions.  Pt's PMH includes PONV, depression, stage I breast cancer, and HTN.     PT Comments    Pt is making progress towards goals and increasing functional independence. Pt safe to D/C from a mobility standpoint based on progression towards goals set on PT eval.  Follow Up Recommendations  Outpatient PT;Supervision for mobility/OOB     Equipment Recommendations  Rolling walker with 5" wheels;3in1 (PT)    Recommendations for Other Services       Precautions / Restrictions Precautions Precautions: Posterior Hip;Fall Precaution Comments: able to recall 3/3 precautions Restrictions LLE Weight Bearing: Weight bearing as tolerated    Mobility  Bed Mobility Overal bed mobility: Modified Independent                Transfers Overall transfer level: Needs assistance Equipment used: Rolling walker (2 wheeled) Transfers: Sit to/from Stand Sit to Stand: Min guard         General transfer comment: Min guard for safety. Cues for position of LLE.  Ambulation/Gait Ambulation/Gait assistance: Min guard Ambulation Distance (Feet): 250 Feet Assistive device: Rolling walker (2 wheeled) Gait Pattern/deviations: Step-through pattern;Decreased stance time - left;Trunk flexed   Gait velocity interpretation: Below normal speed for age/gender General Gait Details: Cues for upright posture/forward head and to relax shoulders.   Stairs   Stairs assistance: Min guard Stair Management: Two rails;Step to pattern;Forwards   General stair comments: Cues for proper sequencing and technique. Min guard for safety.  Wheelchair Mobility    Modified Rankin (Stroke Patients Only)       Balance                                    Cognition Arousal/Alertness:  Awake/alert Behavior During Therapy: WFL for tasks assessed/performed Overall Cognitive Status: Within Functional Limits for tasks assessed                      Exercises Total Joint Exercises Quad Sets: AROM;Left;10 reps Gluteal Sets: AROM;Both;10 reps Heel Slides: AAROM;Left;10 reps Hip ABduction/ADduction: AAROM;Left;10 reps Long Arc Quad: AROM;Left;15 reps    General Comments        Pertinent Vitals/Pain Pain Assessment: 0-10 Pain Score: 5  Pain Location: L hip with ambulation Pain Descriptors / Indicators: Sore Pain Intervention(s): Monitored during session    Home Living                      Prior Function            PT Goals (current goals can now be found in the care plan section) Progress towards PT goals: Progressing toward goals    Frequency  7X/week    PT Plan Current plan remains appropriate    Co-evaluation             End of Session Equipment Utilized During Treatment: Gait belt Activity Tolerance: Patient tolerated treatment well Patient left: in bed;with call bell/phone within reach;with family/visitor present     Time: 3614-4315 PT Time Calculation (min) (ACUTE ONLY): 23 min  Charges:                       G Codes:  Rubye Oaks, [place] 09/27/2014, 8:27 AM

## 2014-09-27 NOTE — Progress Notes (Addendum)
PATIENT ID: Sherri Tucker  MRN: [id number]  DOB/AGE:  [birth year] / 80 y.o.  2 Days Post-Op Procedure(s) (LRB): TOTAL HIP ARTHROPLASTY (Left)    PROGRESS NOTE Subjective: Patient is alert, oriented, no Nausea, no Vomiting, yes passing gas, yes Bowel Movement. Taking PO well with pt up and eating in bed. Denies SOB, Chest or Calf Pain. Using Incentive Spirometer, PAS in place. Ambulate WBAT with pt walking 200 ft with therapy. Patient reports pain as 5 on 0-10 scale  .    Objective: Vital signs in last 24 hours: Filed Vitals:   09/26/14 2024 09/26/14 2341 09/27/14 0327 09/27/14 0553  BP: 118/50   114/47  Pulse: 66   63  Temp: 97.6 F (36.4 C)   98 F (36.7 C)  TempSrc:      Resp: 18 18 18 18   Height:      Weight:      SpO2: 98% 98% 98% 96%      Intake/Output from previous day: I/O last 3 completed shifts: In: 2455 [P.O.:1080; I.V.:1375] Out: 40 [Emesis/NG output:40]   Intake/Output this shift:     LABORATORY DATA:  Recent Labs  09/25/14 1604 09/26/14 0655 09/27/14 0532  WBC  --  7.6 7.8  HGB  --  9.5* 10.1*  HCT  --  29.0* 31.1*  PLT  --  153 158  NA 141 136  --   K 3.6 3.6  --   CL 104 100  --   CO2 30 30  --   BUN 18 9  --   CREATININE 0.74 0.75  --   GLUCOSE 105* 164*  --   CALCIUM 9.2 8.7  --     Examination: Neurologically intact Neurovascular intact Sensation intact distally Intact pulses distally Dorsiflexion/Plantar flexion intact Incision: dressing C/D/I No cellulitis present Compartment soft} XR AP&Lat of hip shows well placed\fixed THA  Assessment:   2 Days Post-Op Procedure(s) (LRB): TOTAL HIP ARTHROPLASTY (Left) ADDITIONAL DIAGNOSIS:  Expected Acute Blood Loss Anemia, Hypertension, Afib.  Pt will need to be followed by cardiology for an outpatient consult.  She may need to be on long term anticoagulation per cardiology.  Plan: PT/OT WBAT, THA  posterior precautions  DVT Prophylaxis: SCDx72 hrs, ASA 325 mg BID x 2  weeks  DISCHARGE PLAN: Home  DISCHARGE NEEDS: HHPT, HHRN, Walker and 3-in-1 comode seat

## 2014-10-19 ENCOUNTER — Encounter: Payer: Medicare Other | Admitting: Physician Assistant

## 2015-03-04 NOTE — Assessment & Plan Note (Signed)
Left breast invasive ductal carcinoma T1 C. N0 M0 stage IA grade 1 ER/PR positive HER-2 negative Ki-67 8% 2 SLN negative status post lumpectomy and radiation. Patient refuses antiestrogen therapy and is currently on observation.  Surveillance: She will be set up for a mammogram in October 2016. Today's breast exam did not reveal any palpable nodules or lumps. I recommended that she continue her annual followups with mammograms and breast exams.  Survivorship: I encouraged her to stay more active and do exercise. Stays very active with her church group and is a devote [employer]. 30 minutes of exercise every day for 5 days a week is what I recommended her. She does yoga once a week in the church.  RTC in 1 year for follow up

## 2015-03-05 ENCOUNTER — Encounter: Payer: Self-pay | Admitting: Hematology and Oncology

## 2015-03-05 ENCOUNTER — Ambulatory Visit (HOSPITAL_BASED_OUTPATIENT_CLINIC_OR_DEPARTMENT_OTHER): Payer: Medicare Other | Admitting: Hematology and Oncology

## 2015-03-05 VITALS — BP 131/51 | HR 64 | Temp 97.6°F | Resp 18 | Ht 63.0 in | Wt 117.6 lb

## 2015-03-05 DIAGNOSIS — C50412 Malignant neoplasm of upper-outer quadrant of left female breast: Secondary | ICD-10-CM | POA: Diagnosis not present

## 2015-03-05 NOTE — Progress Notes (Signed)
Patient Care Team: Maurice Small, MD as PCP - General (Family Medicine)  DIAGNOSIS: No matching staging information was found for the patient.  SUMMARY OF ONCOLOGIC HISTORY:   Breast cancer of upper-outer quadrant of left female breast   05/23/2010 Surgery Left breast lumpectomy 1.5 cm grade 1 stage IA T1 C. N0 M0 invasive ductal carcinoma ER/PR positive HER-2 negative Ki-67 80%, 2 SLN negative   06/08/2010 - 08/05/2010 Radiation Therapy Radiation therapy to the left breast lumpectomy    Anti-estrogen oral therapy Patient declined antiestrogen therapy    CHIEF COMPLIANT:  Annual follow-up of breast cancer  INTERVAL HISTORY: Sherri Tucker is a  80 year old with above-mentioned history of left breast cancer 3 with lumpectomy and radiation and declined antiestrogen therapy. She is here for annual follow-up. She reports no new problems or concerns. Denies any lumps or nodules in the breast.  She will be getting a mammogram in November 2016.  She had left  Hip replacement surgery  REVIEW OF SYSTEMS:   Constitutional: Denies fevers, chills or abnormal weight loss Eyes: Denies blurriness of vision Ears, nose, mouth, throat, and face: Denies mucositis or sore throat Respiratory: Denies cough, dyspnea or wheezes Cardiovascular: Denies palpitation, chest discomfort or lower extremity swelling Gastrointestinal:  Denies nausea, heartburn or change in bowel habits Skin: Denies abnormal skin rashes Lymphatics: Denies new lymphadenopathy or easy bruising Neurological:Denies numbness, tingling or new weaknesses Behavioral/Psych: Mood is stable, no new changes  Breast:  denies any pain or lumps or nodules in either breasts All other systems were reviewed with the patient and are negative.  I have reviewed the past medical history, past surgical history, social history and family history with the patient and they are unchanged from previous note.  ALLERGIES:  is allergic to codeine.  MEDICATIONS:   Current Outpatient Prescriptions  Medication Sig Dispense Refill  . acetaminophen (TYLENOL) 650 MG CR tablet Take 1,300 mg by mouth daily as needed for pain.    . carboxymethylcellulose (REFRESH PLUS) 0.5 % SOLN Place 1 drop into the right eye daily as needed (runny eyes).    . Cholecalciferol (VITAMIN D-3) 1000 UNITS CAPS Take 1,000 Units by mouth daily.    . fish oil-omega-3 fatty acids 1000 MG capsule Take 1 g by mouth daily.     Marland Kitchen glucosamine-chondroitin 500-400 MG tablet Take 1 tablet by mouth 3 (three) times daily.     . hydrochlorothiazide (HYDRODIURIL) 25 MG tablet Take 25 mg by mouth daily.      Marland Kitchen lisinopril (PRINIVIL,ZESTRIL) 10 MG tablet Take 10 mg by mouth daily.      . metoprolol (TOPROL-XL) 100 MG 24 hr tablet Take 100 mg by mouth daily.      . sertraline (ZOLOFT) 50 MG tablet Take 25 mg by mouth at bedtime. Patient takes 1/2 tablet daily. Only taking 25 mg total a day.     No current facility-administered medications for this visit.    PHYSICAL EXAMINATION: ECOG PERFORMANCE STATUS: 1 - Symptomatic but completely ambulatory  Filed Vitals:   03/05/15 1112  BP: 131/51  Pulse: 64  Temp: 97.6 F (36.4 C)  Resp: 18   Filed Weights   03/05/15 1112  Weight: 117 lb 9.6 oz (53.343 kg)    GENERAL:alert, no distress and comfortable SKIN: skin color, texture, turgor are normal, no rashes or significant lesions EYES: normal, Conjunctiva are pink and non-injected, sclera clear OROPHARYNX:no exudate, no erythema and lips, buccal mucosa, and tongue normal  NECK: supple, thyroid normal size, non-tender,  without nodularity LYMPH:  no palpable lymphadenopathy in the cervical, axillary or inguinal LUNGS: clear to auscultation and percussion with normal breathing effort HEART: regular rate & rhythm and no murmurs and no lower extremity edema ABDOMEN:abdomen soft, non-tender and normal bowel sounds Musculoskeletal:no cyanosis of digits and no clubbing  NEURO: alert & oriented x 3  with fluent speech, no focal motor/sensory deficits BREAST: No palpable masses or nodules in either right or left breasts. No palpable axillary supraclavicular or infraclavicular adenopathy no breast tenderness or nipple discharge. (exam performed in the presence of a chaperone)  LABORATORY DATA:  I have reviewed the data as listed   Chemistry      Component Value Date/Time   NA 136 09/26/2014 0655   K 3.6 09/26/2014 0655   CL 100 09/26/2014 0655   CO2 30 09/26/2014 0655   BUN 9 09/26/2014 0655   CREATININE 0.75 09/26/2014 0655      Component Value Date/Time   CALCIUM 8.7 09/26/2014 0655   ALKPHOS 52 12/04/2011 1027   AST 17 12/04/2011 1027   ALT 11 12/04/2011 1027   BILITOT 0.4 12/04/2011 1027       Lab Results  Component Value Date   WBC 7.8 09/27/2014   HGB 10.1* 09/27/2014   HCT 31.1* 09/27/2014   MCV 98.1 09/27/2014   PLT 158 09/27/2014   NEUTROABS 7.0 09/13/2014   ASSESSMENT & PLAN:  Breast cancer of upper-outer quadrant of left female breast Left breast invasive ductal carcinoma T1 C. N0 M0 stage IA grade 1 ER/PR positive HER-2 negative Ki-67 8% 2 SLN negative status post lumpectomy and radiation. Patient refuses antiestrogen therapy and is currently on observation.  Surveillance: She will be set up for a mammogram in October 2016. Today's breast exam did not reveal any palpable nodules or lumps. I recommended that she continue her annual followups with mammograms and breast exams.  Survivorship: I encouraged her to stay more active and do exercise. Stays very active with her church group and is a devote [employer]. 30 minutes of exercise every day for 5 days a week is what I recommended her. She does yoga once a week in the church.   Patient wishes to follow with her primary care physician who does her breast exams annually.  I offered her participation the survivorship clinic.  The patient will follow with her PCP and will call us if there are any problems or  concerns.   No orders of the defined types were placed in this encounter.   The patient has a good understanding of the overall plan. she agrees with it. she will call with any problems that may develop before the next visit here.   Rulon Eisenmenger, MD

## 2015-03-15 ENCOUNTER — Other Ambulatory Visit: Payer: Self-pay | Admitting: Hematology and Oncology

## 2015-03-15 DIAGNOSIS — Z9889 Other specified postprocedural states: Secondary | ICD-10-CM

## 2015-03-15 DIAGNOSIS — Z853 Personal history of malignant neoplasm of breast: Secondary | ICD-10-CM

## 2015-04-12 ENCOUNTER — Ambulatory Visit
Admission: RE | Admit: 2015-04-12 | Discharge: 2015-04-12 | Disposition: A | Discharge status: Home / Self Care | Payer: Medicare Other | Source: Ambulatory Visit | Attending: Hematology and Oncology | Admitting: Hematology and Oncology

## 2015-04-12 DIAGNOSIS — Z9889 Other specified postprocedural states: Secondary | ICD-10-CM

## 2015-04-12 DIAGNOSIS — Z853 Personal history of malignant neoplasm of breast: Secondary | ICD-10-CM

## 2015-11-24 ENCOUNTER — Emergency Department (HOSPITAL_COMMUNITY): Payer: Medicare Other

## 2015-11-24 ENCOUNTER — Encounter (HOSPITAL_COMMUNITY): Payer: Self-pay

## 2015-11-24 ENCOUNTER — Observation Stay (HOSPITAL_COMMUNITY)
Admission: EM | Admit: 2015-11-24 | Discharge: 2015-11-26 | Disposition: A | Discharge status: Home / Self Care | Payer: Medicare Other | Attending: Internal Medicine | Admitting: Internal Medicine

## 2015-11-24 ENCOUNTER — Other Ambulatory Visit: Payer: Self-pay

## 2015-11-24 DIAGNOSIS — R197 Diarrhea, unspecified: Secondary | ICD-10-CM

## 2015-11-24 DIAGNOSIS — Z96642 Presence of left artificial hip joint: Secondary | ICD-10-CM | POA: Insufficient documentation

## 2015-11-24 DIAGNOSIS — I10 Essential (primary) hypertension: Secondary | ICD-10-CM | POA: Diagnosis not present

## 2015-11-24 DIAGNOSIS — K529 Noninfective gastroenteritis and colitis, unspecified: Secondary | ICD-10-CM | POA: Diagnosis not present

## 2015-11-24 DIAGNOSIS — I4891 Unspecified atrial fibrillation: Principal | ICD-10-CM | POA: Insufficient documentation

## 2015-11-24 DIAGNOSIS — R112 Nausea with vomiting, unspecified: Secondary | ICD-10-CM | POA: Diagnosis present

## 2015-11-24 DIAGNOSIS — Z79899 Other long term (current) drug therapy: Secondary | ICD-10-CM | POA: Insufficient documentation

## 2015-11-24 DIAGNOSIS — Z853 Personal history of malignant neoplasm of breast: Secondary | ICD-10-CM | POA: Insufficient documentation

## 2015-11-24 DIAGNOSIS — Z7982 Long term (current) use of aspirin: Secondary | ICD-10-CM | POA: Insufficient documentation

## 2015-11-24 DIAGNOSIS — R1110 Vomiting, unspecified: Secondary | ICD-10-CM | POA: Diagnosis present

## 2015-11-24 LAB — COMPREHENSIVE METABOLIC PANEL
ALT: 12 U/L — ABNORMAL LOW (ref 14–54)
AST: 22 U/L (ref 15–41)
Albumin: 4.1 g/dL (ref 3.5–5.0)
Alkaline Phosphatase: 63 U/L (ref 38–126)
Anion gap: 14 (ref 5–15)
BUN: 23 mg/dL — ABNORMAL HIGH (ref 6–20)
CALCIUM: 10.3 mg/dL (ref 8.9–10.3)
CO2: 20 mmol/L — ABNORMAL LOW (ref 22–32)
Chloride: 102 mmol/L (ref 101–111)
Creatinine, Ser: 1.01 mg/dL — ABNORMAL HIGH (ref 0.44–1.00)
GFR calc non Af Amer: 52 mL/min — ABNORMAL LOW (ref >60)
Glucose, Bld: 172 mg/dL — ABNORMAL HIGH (ref 65–99)
POTASSIUM: 3.4 mmol/L — AB (ref 3.5–5.1)
Sodium: 136 mmol/L (ref 135–145)
Total Bilirubin: 0.7 mg/dL (ref 0.3–1.2)
Total Protein: 7.9 g/dL (ref 6.5–8.1)

## 2015-11-24 LAB — URINALYSIS, ROUTINE W REFLEX MICROSCOPIC
Bilirubin Urine: NEGATIVE
Glucose, UA: NEGATIVE mg/dL
Ketones, ur: 15 mg/dL — AB
Leukocytes, UA: NEGATIVE
Nitrite: NEGATIVE
Protein, ur: NEGATIVE mg/dL
SPECIFIC GRAVITY, URINE: 1.022 (ref 1.005–1.030)
pH: 5.5 (ref 5.0–8.0)

## 2015-11-24 LAB — URINE MICROSCOPIC-ADD ON

## 2015-11-24 LAB — TROPONIN I: Troponin I: 0.03 ng/mL (ref <0.031)

## 2015-11-24 LAB — CBC
HEMATOCRIT: 38.8 % (ref 36.0–46.0)
HEMOGLOBIN: 12.9 g/dL (ref 12.0–15.0)
MCH: 30.5 pg (ref 26.0–34.0)
MCHC: 33.2 g/dL (ref 30.0–36.0)
MCV: 91.7 fL (ref 78.0–100.0)
Platelets: 242 10*3/uL (ref 150–400)
RBC: 4.23 MIL/uL (ref 3.87–5.11)
RDW: 13.3 % (ref 11.5–15.5)
WBC: 10.5 10*3/uL (ref 4.0–10.5)

## 2015-11-24 LAB — PROTIME-INR
INR: 1.25 (ref 0.00–1.49)
Prothrombin Time: 15.9 seconds — ABNORMAL HIGH (ref 11.6–15.2)

## 2015-11-24 LAB — LIPASE, BLOOD: LIPASE: 28 U/L (ref 11–51)

## 2015-11-24 MED ORDER — METOPROLOL SUCCINATE ER 25 MG PO TB24: 100.0000 mg | ORAL_TABLET | Freq: Every day | ORAL | Status: DC

## 2015-11-24 MED ORDER — VITAMIN D 1000 UNITS PO TABS
1000.0000 [IU] | ORAL_TABLET | Freq: Every day | ORAL | Status: DC
  Administered 2015-11-25 – 2015-11-26 (×2): 1000 [IU] via ORAL
  Filled 2015-11-24 (×2): qty 1

## 2015-11-24 MED ORDER — ONDANSETRON HCL 4 MG/2ML IJ SOLN
4.0000 mg | Freq: Four times a day (QID) | INTRAMUSCULAR | Status: DC | PRN
Start: 2015-11-24 — End: 2015-11-26
  Administered 2015-11-25 – 2015-11-26 (×3): 4 mg via INTRAVENOUS
  Filled 2015-11-24 (×3): qty 2

## 2015-11-24 MED ORDER — ENOXAPARIN SODIUM 60 MG/0.6ML ~~LOC~~ SOLN
55.0000 mg | Freq: Two times a day (BID) | SUBCUTANEOUS | Status: DC
  Administered 2015-11-25 (×2): 55 mg via SUBCUTANEOUS
  Filled 2015-11-24 (×3): qty 0.6

## 2015-11-24 MED ORDER — TRAMADOL HCL 50 MG PO TABS
50.0000 mg | ORAL_TABLET | Freq: Every day | ORAL | Status: DC | PRN
  Administered 2015-11-25: 50 mg via ORAL
  Filled 2015-11-24 (×2): qty 1

## 2015-11-24 MED ORDER — SODIUM CHLORIDE 0.9 % IV SOLN
1000.0000 mL | Freq: Once | INTRAVENOUS | Status: AC
  Administered 2015-11-24: 1000 mL via INTRAVENOUS

## 2015-11-24 MED ORDER — DILTIAZEM HCL 100 MG IV SOLR
5.0000 mg/h | INTRAVENOUS | Status: DC
  Administered 2015-11-24: 5 mg/h via INTRAVENOUS
  Filled 2015-11-24: qty 100

## 2015-11-24 MED ORDER — ACETAMINOPHEN 325 MG PO TABS
650.0000 mg | ORAL_TABLET | Freq: Two times a day (BID) | ORAL | Status: DC | PRN
  Administered 2015-11-25 (×2): 650 mg via ORAL
  Filled 2015-11-24 (×2): qty 2

## 2015-11-24 MED ORDER — GLUCOSAMINE 1500 COMPLEX PO CAPS
1.0000 | ORAL_CAPSULE | Freq: Every day | ORAL | Status: DC
Start: 2015-11-25 — End: 2015-11-24

## 2015-11-24 MED ORDER — SERTRALINE HCL 50 MG PO TABS
25.0000 mg | ORAL_TABLET | Freq: Every day | ORAL | Status: DC
  Administered 2015-11-25: 25 mg via ORAL
  Filled 2015-11-24: qty 1

## 2015-11-24 MED ORDER — SODIUM CHLORIDE 0.9 % IV BOLUS (SEPSIS)
500.0000 mL | Freq: Once | INTRAVENOUS | Status: AC
  Administered 2015-11-24: 500 mL via INTRAVENOUS

## 2015-11-24 MED ORDER — ONDANSETRON 4 MG PO TBDP
4.0000 mg | ORAL_TABLET | Freq: Once | ORAL | Status: AC
  Administered 2015-11-24: 4 mg via ORAL

## 2015-11-24 MED ORDER — ONDANSETRON 4 MG PO TBDP
ORAL_TABLET | ORAL | Status: AC
  Filled 2015-11-24: qty 1

## 2015-11-24 MED ORDER — ONDANSETRON HCL 4 MG PO TABS: 4.0000 mg | ORAL_TABLET | Freq: Once | ORAL | Status: DC

## 2015-11-24 MED ORDER — OMEGA-3-ACID ETHYL ESTERS 1 G PO CAPS
1000.0000 mg | ORAL_CAPSULE | Freq: Every evening | ORAL | Status: DC
  Administered 2015-11-25: 1000 mg via ORAL
  Filled 2015-11-24: qty 1

## 2015-11-24 MED ORDER — SODIUM CHLORIDE 0.9 % IV SOLN
1000.0000 mL | INTRAVENOUS | Status: DC
  Administered 2015-11-24 (×2): 1000 mL via INTRAVENOUS

## 2015-11-24 MED ORDER — ASPIRIN 81 MG PO CHEW
81.0000 mg | CHEWABLE_TABLET | Freq: Every day | ORAL | Status: DC
  Administered 2015-11-25: 81 mg via ORAL
  Filled 2015-11-24: qty 1

## 2015-11-24 MED ORDER — ENOXAPARIN SODIUM 60 MG/0.6ML ~~LOC~~ SOLN
1.0000 mg/kg | Freq: Once | SUBCUTANEOUS | Status: AC
  Administered 2015-11-24: 55 mg via SUBCUTANEOUS
  Filled 2015-11-24: qty 0.6

## 2015-11-24 MED ORDER — DILTIAZEM LOAD VIA INFUSION
10.0000 mg | Freq: Once | INTRAVENOUS | Status: AC
  Administered 2015-11-24: 10 mg via INTRAVENOUS
  Filled 2015-11-24: qty 10

## 2015-11-24 NOTE — ED Notes (Signed)
Patient complains of vomiting and diarrhea that started yesterday am. Yesterday had pain and cramping but that has resolved today. The vomiting and diarrhea is persisting, complains of weakness. Alert and ortiented

## 2015-11-24 NOTE — ED Provider Notes (Signed)
CSN: [id number]     Arrival date & time 11/24/15  1640 History   First MD Initiated Contact with Patient 11/24/15 1819     Chief Complaint  Patient presents with  . Emesis  . Diarrhea     (Consider location/radiation/quality/duration/timing/severity/associated sxs/prior Treatment) HPI Patient had approximately 5-6 episodes of diarrhea yesterday. She reports today she has had several episodes of vomiting. She reports she is also having dry heaves. She reports the diarrhea is less today. She denies any abdominal pain. No fever. She does report today she feels weak and got lightheaded with standing several times. No syncopal episode. No chest pain and no shortness of breath. She does not know of any sick contacts. Past Medical History  Diagnosis Date  . Cancer ([place])   . Hemorrhoids   . Colon polyp   . Arthritis   . Hypertension   . Breast cancer, stage 1 ([place]) 12/04/2011  . Arthritis 12/04/2011  . History of radiation therapy 12/04/2011  . Complication of anesthesia   . PONV (postoperative nausea and vomiting)   . Depression   . History of kidney stones    Past Surgical History  Procedure Laterality Date  . Abdominal hysterectomy    . Breast surgery  11    left lumpectomy  . Total hip arthroplasty Left 09/25/2014    Procedure: TOTAL HIP ARTHROPLASTY;  Surgeon: [name], MD;  Location: [facility];  Service: Orthopedics;  Laterality: Left;   Family History  Problem Relation Age of Onset  . Heart disease Sister    Social History  Substance Use Topics  . Smoking status: Never Smoker   . Smokeless tobacco: Never Used  . Alcohol Use: No   OB History    No data available     Review of Systems  10 Systems reviewed and are negative for acute change except as noted in the HPI.   Allergies  Codeine  Home Medications   Prior to Admission medications   Medication Sig Start Date End Date Taking? Authorizing Provider  acetaminophen (TYLENOL) 650 MG CR tablet Take 1,300 mg by  mouth daily as needed for pain.   Yes Historical Provider, MD  aspirin 81 MG tablet Take 81 mg by mouth daily.   Yes Historical Provider, MD  Cholecalciferol (VITAMIN D-3) 1000 UNITS CAPS Take 1,000 Units by mouth daily.   Yes Historical Provider, MD  Glucosamine-Chondroit-Vit C-Mn (GLUCOSAMINE 1500 COMPLEX) CAPS Take 1 capsule by mouth daily.   Yes Historical Provider, MD  hydrochlorothiazide (HYDRODIURIL) 25 MG tablet Take 25 mg by mouth daily.     Yes Historical Provider, MD  lisinopril (PRINIVIL,ZESTRIL) 10 MG tablet Take 10 mg by mouth daily.     Yes Historical Provider, MD  metoprolol (TOPROL-XL) 100 MG 24 hr tablet Take 100 mg by mouth daily.     Yes Historical Provider, MD  Omega 3 1200 MG CAPS Take 1,200 mg by mouth every evening.   Yes Historical Provider, MD  sertraline (ZOLOFT) 50 MG tablet Take 25 mg by mouth at bedtime. Patient takes 1/2 tablet daily. Only taking 25 mg total a day. 04/28/11  Yes Historical Provider, MD  traMADol (ULTRAM) 50 MG tablet Take 50 mg by mouth daily as needed. 11/09/15  Yes Historical Provider, MD   BP 129/75 mmHg  Pulse 129  Temp(Src) 97.5 F (36.4 C) (Oral)  Resp 13  Ht 5\' 3"  (1.6 m)  Wt 117 lb (53.071 kg)  BMI 20.73 kg/m2  SpO2 97% Physical Exam  Constitutional: She is oriented to person, place, and time. She appears well-developed and well-nourished.  HENT:  Head: Normocephalic and atraumatic.  Eyes: EOM are normal. Pupils are equal, round, and reactive to light.  Neck: Neck supple.  Cardiovascular: Normal rate, regular rhythm, normal heart sounds and intact distal pulses.   Pulmonary/Chest: Effort normal and breath sounds normal.  Abdominal: Soft. Bowel sounds are normal. She exhibits no distension. There is no tenderness.  Musculoskeletal: Normal range of motion. She exhibits no edema.  Neurological: She is alert and oriented to person, place, and time. She has normal strength. Coordination normal. GCS eye subscore is 4. GCS verbal subscore  is 5. GCS motor subscore is 6.  Skin: Skin is warm, dry and intact.  Psychiatric: She has a normal mood and affect.    ED Course  Procedures (including critical care time)  CRITICAL CARE Performed by: Charlesetta Shanks   Total critical care time: 30 minutes  Critical care time was exclusive of separately billable procedures and treating other patients.  Critical care was necessary to treat or prevent imminent or life-threatening deterioration.  Critical care was time spent personally by me on the following activities: development of treatment plan with patient and/or surrogate as well as nursing, discussions with consultants, evaluation of patient's response to treatment, examination of patient, obtaining history from patient or surrogate, ordering and performing treatments and interventions, ordering and review of laboratory studies, ordering and review of radiographic studies, pulse oximetry and re-evaluation of patient's condition. Labs Review Labs Reviewed  COMPREHENSIVE METABOLIC PANEL - Abnormal; Notable for the following:    Potassium 3.4 (*)    CO2 20 (*)    Glucose, Bld 172 (*)    BUN 23 (*)    Creatinine, Ser 1.01 (*)    ALT 12 (*)    GFR calc non Af Amer 52 (*)    All other components within normal limits  URINALYSIS, ROUTINE W REFLEX MICROSCOPIC (NOT AT [facility]) - Abnormal; Notable for the following:    Hgb urine dipstick SMALL (*)    Ketones, ur 15 (*)    All other components within normal limits  URINE MICROSCOPIC-ADD ON - Abnormal; Notable for the following:    Squamous Epithelial / LPF 0-5 (*)    Bacteria, UA RARE (*)    All other components within normal limits  LIPASE, BLOOD  CBC  TROPONIN I  PROTIME-INR  CBC  BASIC METABOLIC PANEL    Imaging Review Dg Chest Port 1 View  11/24/2015  CLINICAL DATA:  Atrial fibrillation. Vomiting and diarrhea. Weakness. EXAM: PORTABLE CHEST 1 VIEW COMPARISON:  09/13/2014 FINDINGS: Lower lung volumes from prior exam. Heart at  the upper limits normal size, unchanged allowing for differences in technique. There is atherosclerosis of the thoracic aorta. No evidence of pulmonary edema, focal airspace disease, large pleural effusion or pneumothorax. Unchanged osseous structures. IMPRESSION: No active disease. Electronically Signed   By: [name] M.D.   On: 11/24/2015 22:23   I have personally reviewed and evaluated these images and lab results as part of my medical decision-making.  EKG:Interpreted by EDP Kween Bacorn Atrial fibrillation 127 QRS axis 92 diffuse nonspecific ST depression. Abnormal.  New atrial fibrillation and nonspecofic [place]  compared with old EKG 09/2014  Consult: [facility] hospitalist for admission. MDM   Final diagnoses:  Atrial fibrillation with rapid ventricular response ([place])  Gastroenteritis   Patient had 2 days of GI symptoms consistent with gastroenteritis. Uncomplicated vomiting and diarrhea without fever or abdominal pain.  While in the emergency department, the patient developed atrial fibrillation with rapid ventricular response. She denies chest pain. Reviewed EMR indicates the patient had one episode of perioperative paroxysmal atrial fibrillation. She is not on anticoagulants and has not had recurrent episodes. Patient's rate is 130s. At this time she will be started on Cardizem bolus and drip. Lovenox has been ordered. Patient will be admitted for ongoing monitoring and treatment.    Charlesetta Shanks, MD 11/24/15 [phone]

## 2015-11-24 NOTE — Progress Notes (Signed)
ANTICOAGULATION CONSULT NOTE - Initial Consult  Pharmacy Consult for Lovenox  Indication: atrial fibrillation  Allergies  Allergen Reactions  . Codeine Nausea And Vomiting    Patient Measurements: Height: 5\' 3"  (160 cm) Weight: 117 lb (53.071 kg) IBW/kg (Calculated) : 52.4  Vital Signs: Temp: 97.5 F (36.4 C) (06/17 1649) Temp Source: Oral (06/17 1649) BP: 129/75 mmHg (06/17 2245) Pulse Rate: 129 (06/17 2245)  Labs:  Recent Labs  11/24/15 1658  HGB 12.9  HCT 38.8  PLT 242  CREATININE 1.01*    Estimated Creatinine Clearance: 38 mL/min (by C-G formula based on Cr of 1.01).   Medical History: Past Medical History  Diagnosis Date  . Cancer ([place])   . Hemorrhoids   . Colon polyp   . Arthritis   . Hypertension   . Breast cancer, stage 1 ([place]) 12/04/2011  . Arthritis 12/04/2011  . History of radiation therapy 12/04/2011  . Complication of anesthesia   . PONV (postoperative nausea and vomiting)   . Depression   . History of kidney stones     Medications:  See electronic med rec  Assessment: 81 y.o. F presents with afib. Pt with afib once before peri-op for hip replacement. CBC stable.   Goal of Therapy:  Heparin level 0.3-0.7 units/ml Monitor platelets by anticoagulation protocol: Yes   Plan:  Lovenox 55 mg SQ q12h Will f/u renal function and CBC q72h  Sherlon Handing, PharmD, BCPS Clinical pharmacist, pager [phone] 11/24/2015,10:52 PM

## 2015-11-24 NOTE — H&P (Signed)
History and Physical    Sherri Tucker [id number] DOB: May 26, 1935 DOA: 11/24/2015   PCP: [name], MD Chief Complaint:  Chief Complaint  Patient presents with  . Emesis  . Diarrhea    HPI: Sherri Tucker is a 81 y.o. female with medical history significant of A.Fib once before peri-op for a hip replacement, HTN.  Patient presents to the ED with c/o generalized weakness, light headedness, and 1 day history of N/V/D that has been persistent.  No syncopal episode, no CP, no SOB, no known sick contacts.  Lightheadedness occurs on standing.  Diarrhea and N/V have actually been improving.  ED Course: Found to have A.Fib RVR.  Review of Systems: As per HPI otherwise 10 point review of systems negative.    Past Medical History  Diagnosis Date  . Cancer ([place])   . Hemorrhoids   . Colon polyp   . Arthritis   . Hypertension   . Breast cancer, stage 1 ([place]) 12/04/2011  . Arthritis 12/04/2011  . History of radiation therapy 12/04/2011  . Complication of anesthesia   . PONV (postoperative nausea and vomiting)   . Depression   . History of kidney stones     Past Surgical History  Procedure Laterality Date  . Abdominal hysterectomy    . Breast surgery  11    left lumpectomy  . Total hip arthroplasty Left 09/25/2014    Procedure: TOTAL HIP ARTHROPLASTY;  Surgeon: [name], MD;  Location: [facility];  Service: Orthopedics;  Laterality: Left;     reports that she has never smoked. She has never used smokeless tobacco. She reports that she does not drink alcohol or use illicit drugs.  Allergies  Allergen Reactions  . Codeine Nausea And Vomiting    Family History  Problem Relation Age of Onset  . Heart disease Sister      Prior to Admission medications   Medication Sig Start Date End Date Taking? Authorizing Provider  acetaminophen (TYLENOL) 650 MG CR tablet Take 1,300 mg by mouth daily as needed for pain.   Yes Historical Provider, MD  aspirin 81 MG tablet Take 81 mg by  mouth daily.   Yes Historical Provider, MD  Cholecalciferol (VITAMIN D-3) 1000 UNITS CAPS Take 1,000 Units by mouth daily.   Yes Historical Provider, MD  Glucosamine-Chondroit-Vit C-Mn (GLUCOSAMINE 1500 COMPLEX) CAPS Take 1 capsule by mouth daily.   Yes Historical Provider, MD  hydrochlorothiazide (HYDRODIURIL) 25 MG tablet Take 25 mg by mouth daily.     Yes Historical Provider, MD  lisinopril (PRINIVIL,ZESTRIL) 10 MG tablet Take 10 mg by mouth daily.     Yes Historical Provider, MD  metoprolol (TOPROL-XL) 100 MG 24 hr tablet Take 100 mg by mouth daily.     Yes Historical Provider, MD  Omega 3 1200 MG CAPS Take 1,200 mg by mouth every evening.   Yes Historical Provider, MD  sertraline (ZOLOFT) 50 MG tablet Take 25 mg by mouth at bedtime. Patient takes 1/2 tablet daily. Only taking 25 mg total a day. 04/28/11  Yes Historical Provider, MD  traMADol (ULTRAM) 50 MG tablet Take 50 mg by mouth daily as needed. 11/09/15  Yes Historical Provider, MD    Physical Exam: Filed Vitals:   11/24/15 2115 11/24/15 2130 11/24/15 2145 11/24/15 2215  BP: 127/81 134/87 102/88 142/74  Pulse: 130 132 127 143  Temp:      TempSrc:      Resp:    18  Height:  Weight:      SpO2: 96% 97% 98% 96%      Constitutional: NAD, calm, comfortable Eyes: PERRL, lids and conjunctivae normal ENMT: Mucous membranes are moist. Posterior pharynx clear of any exudate or lesions.Normal dentition.  Neck: normal, supple, no masses, no thyromegaly Respiratory: clear to auscultation bilaterally, no wheezing, no crackles. Normal respiratory effort. No accessory muscle use.  Cardiovascular: IRR, IRR, tachycardic no murmurs / rubs / gallops. No extremity edema. 2+ pedal pulses. No carotid bruits.  Abdomen: no tenderness, no masses palpated. No hepatosplenomegaly. Bowel sounds positive.  Musculoskeletal: no clubbing / cyanosis. No joint deformity upper and lower extremities. Good ROM, no contractures. Normal muscle tone.  Skin: no  rashes, lesions, ulcers. No induration Neurologic: CN 2-12 grossly intact. Sensation intact, DTR normal. Strength 5/5 in all 4.  Psychiatric: Normal judgment and insight. Alert and oriented x 3. Normal mood.    Labs on Admission: I have personally reviewed following labs and imaging studies  CBC:  Recent Labs Lab 11/24/15 1658  WBC 10.5  HGB 12.9  HCT 38.8  MCV 91.7  PLT [id number]   Basic Metabolic Panel:  Recent Labs Lab 11/24/15 1658  NA 136  K 3.4*  CL 102  CO2 20*  GLUCOSE 172*  BUN 23*  CREATININE 1.01*  CALCIUM 10.3   GFR: Estimated Creatinine Clearance: 38 mL/min (by C-G formula based on Cr of 1.01). Liver Function Tests:  Recent Labs Lab 11/24/15 1658  AST 22  ALT 12*  ALKPHOS 63  BILITOT 0.7  PROT 7.9  ALBUMIN 4.1    Recent Labs Lab 11/24/15 1658  LIPASE 28   No results for input(s): AMMONIA in the last 168 hours. Coagulation Profile: No results for input(s): INR, PROTIME in the last 168 hours. Cardiac Enzymes: No results for input(s): CKTOTAL, CKMB, CKMBINDEX, TROPONINI in the last 168 hours. BNP (last 3 results) No results for input(s): PROBNP in the last 8760 hours. HbA1C: No results for input(s): HGBA1C in the last 72 hours. CBG: No results for input(s): GLUCAP in the last 168 hours. Lipid Profile: No results for input(s): CHOL, HDL, LDLCALC, TRIG, CHOLHDL, LDLDIRECT in the last 72 hours. Thyroid Function Tests: No results for input(s): TSH, T4TOTAL, FREET4, T3FREE, THYROIDAB in the last 72 hours. Anemia Panel: No results for input(s): VITAMINB12, FOLATE, FERRITIN, TIBC, IRON, RETICCTPCT in the last 72 hours. Urine analysis:    Component Value Date/Time   COLORURINE YELLOW 11/24/2015 [place] 11/24/2015 1705   LABSPEC 1.022 11/24/2015 1705   PHURINE 5.5 11/24/2015 1705   GLUCOSEU NEGATIVE 11/24/2015 1705   HGBUR SMALL* 11/24/2015 1705   BILIRUBINUR NEGATIVE 11/24/2015 1705   KETONESUR 15* 11/24/2015 1705    PROTEINUR NEGATIVE 11/24/2015 1705   UROBILINOGEN 0.2 09/13/2014 1331   NITRITE NEGATIVE 11/24/2015 1705   LEUKOCYTESUR NEGATIVE 11/24/2015 1705   Sepsis Labs: @LABRCNTIP (procalcitonin:4,lacticidven:4) )No results found for this or any previous visit (from the past 240 hour(s)).   Radiological Exams on Admission: Dg Chest Port 1 View  11/24/2015  CLINICAL DATA:  Atrial fibrillation. Vomiting and diarrhea. Weakness. EXAM: PORTABLE CHEST 1 VIEW COMPARISON:  09/13/2014 FINDINGS: Lower lung volumes from prior exam. Heart at the upper limits normal size, unchanged allowing for differences in technique. There is atherosclerosis of the thoracic aorta. No evidence of pulmonary edema, focal airspace disease, large pleural effusion or pneumothorax. Unchanged osseous structures. IMPRESSION: No active disease. Electronically Signed   By: [name] M.D.   On: 11/24/2015 22:23  EKG: Independently reviewed.  Assessment/Plan Principal Problem:   Atrial fibrillation with RVR ([facility]) Active Problems:   Nausea vomiting and diarrhea   HTN (hypertension)   A.Fib RVR -  Cardizem gtt for rate control  lovenox  EDP calling cards now  CHADS vasc score = 4  HTN - holding HCTZ and lisinopril  N/V/D - likely gastroenteritis, hydrating patient, already appears to be improving     DVT prophylaxis: on full dose lovenox for A.Fib Code Status: Full Family Communication: Daughter at bedside Consults called: EDP calling cards Admission status: Admit to obs   Carlos Heber, [facility] Pager [phone] from 7PM-7AM  If 7AM-7PM, please contact the day physician for the patient [email or web address] Password [facility]  11/24/2015, 10:46 PM

## 2015-11-25 ENCOUNTER — Other Ambulatory Visit: Payer: Self-pay

## 2015-11-25 DIAGNOSIS — I4891 Unspecified atrial fibrillation: Secondary | ICD-10-CM

## 2015-11-25 DIAGNOSIS — R9431 Abnormal electrocardiogram [ECG] [EKG]: Secondary | ICD-10-CM | POA: Diagnosis not present

## 2015-11-25 DIAGNOSIS — R011 Cardiac murmur, unspecified: Secondary | ICD-10-CM

## 2015-11-25 DIAGNOSIS — K529 Noninfective gastroenteritis and colitis, unspecified: Secondary | ICD-10-CM | POA: Diagnosis not present

## 2015-11-25 DIAGNOSIS — Z7982 Long term (current) use of aspirin: Secondary | ICD-10-CM | POA: Diagnosis not present

## 2015-11-25 DIAGNOSIS — I10 Essential (primary) hypertension: Secondary | ICD-10-CM | POA: Diagnosis not present

## 2015-11-25 LAB — CBC
HCT: 35.4 % — ABNORMAL LOW (ref 36.0–46.0)
Hemoglobin: 11.5 g/dL — ABNORMAL LOW (ref 12.0–15.0)
MCH: 30 pg (ref 26.0–34.0)
MCHC: 32.5 g/dL (ref 30.0–36.0)
MCV: 92.4 fL (ref 78.0–100.0)
PLATELETS: 187 10*3/uL (ref 150–400)
RBC: 3.83 MIL/uL — ABNORMAL LOW (ref 3.87–5.11)
RDW: 13.8 % (ref 11.5–15.5)
WBC: 11.7 10*3/uL — ABNORMAL HIGH (ref 4.0–10.5)

## 2015-11-25 LAB — BASIC METABOLIC PANEL
Anion gap: 11 (ref 5–15)
BUN: 23 mg/dL — AB (ref 6–20)
CALCIUM: 9.1 mg/dL (ref 8.9–10.3)
CO2: 21 mmol/L — ABNORMAL LOW (ref 22–32)
CREATININE: 0.94 mg/dL (ref 0.44–1.00)
Chloride: 104 mmol/L (ref 101–111)
GFR calc non Af Amer: 57 mL/min — ABNORMAL LOW (ref >60)
Glucose, Bld: 147 mg/dL — ABNORMAL HIGH (ref 65–99)
Potassium: 3.7 mmol/L (ref 3.5–5.1)
SODIUM: 136 mmol/L (ref 135–145)

## 2015-11-25 LAB — MRSA PCR SCREENING: MRSA BY PCR: NEGATIVE

## 2015-11-25 LAB — TSH: TSH: 0.212 u[IU]/mL — AB (ref 0.350–4.500)

## 2015-11-25 MED ORDER — METOPROLOL SUCCINATE ER 50 MG PO TB24: 150.0000 mg | ORAL_TABLET | Freq: Every day | ORAL | Status: DC

## 2015-11-25 MED ORDER — SODIUM CHLORIDE 0.9 % IV SOLN
1000.0000 mL | INTRAVENOUS | Status: DC
  Administered 2015-11-25 – 2015-11-26 (×3): 1000 mL via INTRAVENOUS

## 2015-11-25 NOTE — Consult Note (Signed)
CARDIOLOGY CONSULT NOTE       Patient ID: Sherri Tucker MRN: [id number] DOB/AGE: [birth year] 81 y.o.  Admit date: 11/24/2015 Referring Physician:  [name] Primary Physician: [name], MD Primary Cardiologist: New Reason for Consultation: PAF  Principal Problem:   Atrial fibrillation with RVR ([name]) Active Problems:   Nausea vomiting and diarrhea   HTN (hypertension)   HPI:  81 y.o. with HTN. Had episode of PAF in 2016 during hip surgery. Admited with 3 day GI illness Nausea, vomiting and diarrhea Noted to have PAF with rapid rate. Has converted On higher dose Torprol.    This patients CHA2DS2-VASc Score and unadjusted Ischemic Stroke Rate (% per year) is equal to 4.8 % stroke rate/year from a score of 4  Above score calculated as 1 point each if present [CHF, HTN, DM, Vascular=MI/PAD/Aortic Plaque, Age if 65-74, or Female] Above score calculated as 2 points each if present [Age > 75, or Stroke/TIA/TE]  No chest pain, dyspnea or syncope Feels palpitations when in afib. No contraindications to anticoagulation. Does not want to be on coumadin  Echo is pending No history of valvular disease, murmur.  TSH/T4 is normal  Currently still not on solid food. No abdominal pain but still with some nausea .    ROS All other systems reviewed and negative except as noted above  Past Medical History  Diagnosis Date  . Cancer ([place])   . Hemorrhoids   . Colon polyp   . Arthritis   . Hypertension   . Breast cancer, stage 1 ([place]) 12/04/2011  . Arthritis 12/04/2011  . History of radiation therapy 12/04/2011  . Complication of anesthesia   . PONV (postoperative nausea and vomiting)   . Depression   . History of kidney stones     Family History  Problem Relation Age of Onset  . Heart disease Sister     Social History   Social History  . Marital Status: Widowed    Spouse Name: [name]  . Number of Children: [name]  . Years of Education: [name]   Occupational History  . Not on file.    Social History Main Topics  . Smoking status: Never Smoker   . Smokeless tobacco: Never Used  . Alcohol Use: No  . Drug Use: No  . Sexual Activity: Not Currently   Other Topics Concern  . Not on file   Social History Narrative    Past Surgical History  Procedure Laterality Date  . Abdominal hysterectomy    . Breast surgery  11    left lumpectomy  . Total hip arthroplasty Left 09/25/2014    Procedure: TOTAL HIP ARTHROPLASTY;  Surgeon: [name], MD;  Location: [facility];  Service: Orthopedics;  Laterality: Left;     . aspirin  81 mg Oral Daily  . cholecalciferol  1,000 Units Oral Daily  . enoxaparin (LOVENOX) injection  55 mg Subcutaneous Q12H  . omega-3 acid ethyl esters  1,000 mg Oral QPM  . sertraline  25 mg Oral QHS   . sodium chloride 1,000 mL (11/25/15 0816)  . diltiazem (CARDIZEM) infusion 2.5 mg/hr (11/25/15 0300)    Physical Exam: Blood pressure 124/56, pulse 63, temperature 98.4 F (36.9 C), temperature source Oral, resp. rate 14, height 5\' 3"  (1.6 m), weight 114 lb (51.71 kg), SpO2 97 %.    Affect appropriate Healthy:  appears stated age [place] female  HEENT: normal Neck supple with no adenopathy JVP normal no bruits no thyromegaly Lungs clear with no wheezing  and good diaphragmatic motion Heart:  S1/S2 MR murmur, no rub, gallop or click PMI normal Abdomen: benighn, BS positve, no tenderness, no AAA no bruit.  No HSM or HJR Distal pulses intact with no bruits No edema Neuro non-focal Skin warm and dry No muscular weakness   Labs:   Lab Results  Component Value Date   WBC 11.7* 11/25/2015   HGB 11.5* 11/25/2015   HCT 35.4* 11/25/2015   MCV 92.4 11/25/2015   PLT 187 11/25/2015    Recent Labs Lab 11/24/15 1658 11/25/15 0324  NA 136 136  K 3.4* 3.7  CL 102 104  CO2 20* 21*  BUN 23* 23*  CREATININE 1.01* 0.94  CALCIUM 10.3 9.1  PROT 7.9  --   BILITOT 0.7  --   ALKPHOS 63  --   ALT 12*  --   AST 22  --   GLUCOSE 172* 147*   Lab  Results  Component Value Date   TROPONINI 0.03 11/24/2015   No results found for: CHOL No results found for: HDL No results found for: LDLCALC No results found for: TRIG No results found for: CHOLHDL No results found for: LDLDIRECT    Radiology: Dg Chest Port 1 View  11/24/2015  CLINICAL DATA:  Atrial fibrillation. Vomiting and diarrhea. Weakness. EXAM: PORTABLE CHEST 1 VIEW COMPARISON:  09/13/2014 FINDINGS: Lower lung volumes from prior exam. Heart at the upper limits normal size, unchanged allowing for differences in technique. There is atherosclerosis of the thoracic aorta. No evidence of pulmonary edema, focal airspace disease, large pleural effusion or pneumothorax. Unchanged osseous structures. IMPRESSION: No active disease. Electronically Signed   By: [name] M.D.   On: 11/24/2015 22:23    EKG:  NSR lateral Q waves   ASSESSMENT AND PLAN:  PAF:  Continue beta blocker start eliquis 5 bid. Discussed bleeding risk GFR 57  Abnormal ECG: lateral Q waves no history of chest pain Echo Murmur: MR may be related to PAF  Echo today  GI:  Hydrate zofran for nausea. Advance diet as tolerated exam non acute plan per primary service  Signed: [name] 11/25/2015, 10:02 AM

## 2015-11-25 NOTE — Progress Notes (Signed)
PROGRESS NOTE                                                                                                                                                                                                             Patient Demographics:    Sherri Tucker, is a 81 y.o. female, DOB - 17-Sep-1934, [id number]  Admit date - 11/24/2015   Admitting Physician Etta Quill, DO  Outpatient Primary MD for the patient is WEBB, Valla Leaver, MD  LOS -   Chief Complaint  Patient presents with  . Emesis  . Diarrhea       Brief Narrative    Sherri Tucker is a 81 y.o. female with medical history significant of A.Fib once before peri-op for a hip replacement, HTN. Patient presents to the ED with c/o generalized weakness, light headedness, and 1 day history of N/V/D that has been persistent. No syncopal episode, no CP, no SOB, no known sick contacts. Lightheadedness occurs on standing. Diarrhea and N/V have actually been improving.  ED Course: Found to have A.Fib RVR.    Subjective:    Sherri Tucker today has, No headache, No chest pain, No abdominal pain - No Nausea, No new weakness tingling or numbness, No Cough - SOB.     Assessment  & Plan :     1.Paroxysmal atrial fibrillation with RVR [place] vasc 2 score of at least 4. Briefly placed on Cardizem drip, now in sinus, Cardizem drip will be stopped, home dose Toprol-XL has been increased from 100-150 daily, will obtain TSH and echogram. Cards has been consulted. May be a candidate for Eliquis.  2. Recent gastroenteritis. Possibly causing mild dehydration and #1 above, hydrate, increase activity, if stable discharge in the morning.  3. Essential hypertension. Increased home dose beta blocker for #1 above.  4. Osteoporosis. Continue home supplements.    Code Status :  Full  Family Communication  :  Son bedside  Disposition Plan  :  Home in am  Consults  :   Cards  Procedures  :   TTE  DVT Prophylaxis  :  Lovenox    Lab Results  Component Value Date   PLT 187 11/25/2015    Inpatient Medications  Scheduled Meds: . aspirin  81 mg Oral Daily  . cholecalciferol  1,000 Units Oral Daily  .  enoxaparin (LOVENOX) injection  55 mg Subcutaneous Q12H  . omega-3 acid ethyl esters  1,000 mg Oral QPM  . sertraline  25 mg Oral QHS   Continuous Infusions: . sodium chloride 1,000 mL (11/25/15 0816)  . diltiazem (CARDIZEM) infusion 2.5 mg/hr (11/25/15 0300)   PRN Meds:.acetaminophen, ondansetron (ZOFRAN) IV, traMADol  Antibiotics  :    Anti-infectives    None         Objective:   Filed Vitals:   11/25/15 0400 11/25/15 0430 11/25/15 0500 11/25/15 0600  BP: 122/56 123/53 124/64 124/56  Pulse: 67 61 64 63  Temp:    98.4 F (36.9 C)  TempSrc:    Oral  Resp: 13 17 14 14   Height:      Weight:      SpO2: 99% 96% 98% 97%    Wt Readings from Last 3 Encounters:  11/25/15 51.71 kg (114 lb)  03/05/15 53.343 kg (117 lb 9.6 oz)  09/25/14 53.298 kg (117 lb 8 oz)     Intake/Output Summary (Last 24 hours) at 11/25/15 0946 Last data filed at 11/25/15 0900  Gross per 24 hour  Intake  267.5 ml  Output    250 ml  Net   17.5 ml     Physical Exam  Awake Alert, Oriented X 3, No new F.N deficits, Normal affect [place].AT,PERRAL Supple Neck,No JVD, No cervical lymphadenopathy appriciated.  Symmetrical Chest wall movement, Good air movement bilaterally, CTAB RRR,No Gallops,Rubs or new Murmurs, No Parasternal Heave +ve B.Sounds, Abd Soft, No tenderness, No organomegaly appriciated, No rebound - guarding or rigidity. No Cyanosis, Clubbing or edema, No new Rash or bruise      Data Review:    CBC  Recent Labs Lab 11/24/15 1658 11/25/15 0324  WBC 10.5 11.7*  HGB 12.9 11.5*  HCT 38.8 35.4*  PLT 242 187  MCV 91.7 92.4  MCH 30.5 30.0  MCHC 33.2 32.5  RDW 13.3 13.8    Chemistries   Recent Labs Lab 11/24/15 1658 11/25/15 0324   NA 136 136  K 3.4* 3.7  CL 102 104  CO2 20* 21*  GLUCOSE 172* 147*  BUN 23* 23*  CREATININE 1.01* 0.94  CALCIUM 10.3 9.1  AST 22  --   ALT 12*  --   ALKPHOS 63  --   BILITOT 0.7  --    ------------------------------------------------------------------------------------------------------------------ No results for input(s): CHOL, HDL, LDLCALC, TRIG, CHOLHDL, LDLDIRECT in the last 72 hours.  No results found for: HGBA1C ------------------------------------------------------------------------------------------------------------------ No results for input(s): TSH, T4TOTAL, T3FREE, THYROIDAB in the last 72 hours.  Invalid input(s): FREET3 ------------------------------------------------------------------------------------------------------------------ No results for input(s): VITAMINB12, FOLATE, FERRITIN, TIBC, IRON, RETICCTPCT in the last 72 hours.  Coagulation profile  Recent Labs Lab 11/24/15 2300  INR 1.25    No results for input(s): DDIMER in the last 72 hours.  Cardiac Enzymes  Recent Labs Lab 11/24/15 2300  TROPONINI 0.03   ------------------------------------------------------------------------------------------------------------------ No results found for: BNP  Micro Results Recent Results (from the past 240 hour(s))  MRSA PCR Screening     Status: None   Collection Time: 11/24/15 11:50 PM  Result Value Ref Range Status   MRSA by PCR NEGATIVE NEGATIVE Final    Comment:        The GeneXpert MRSA Assay (FDA approved for NASAL specimens only), is one component of a comprehensive MRSA colonization surveillance program. It is not intended to diagnose MRSA infection nor to guide or monitor treatment for MRSA infections.     Radiology  Reports Dg Chest Port 1 View  11/24/2015  CLINICAL DATA:  Atrial fibrillation. Vomiting and diarrhea. Weakness. EXAM: PORTABLE CHEST 1 VIEW COMPARISON:  09/13/2014 FINDINGS: Lower lung volumes from prior exam. Heart at the  upper limits normal size, unchanged allowing for differences in technique. There is atherosclerosis of the thoracic aorta. No evidence of pulmonary edema, focal airspace disease, large pleural effusion or pneumothorax. Unchanged osseous structures. IMPRESSION: No active disease. Electronically Signed   By: [name] M.D.   On: 11/24/2015 22:23    Time Spent in minutes  30   SINGH,PRASHANT K M.D on 11/25/2015 at 9:46 AM  Between 7am to 7pm - Pager - [phone]  After 7pm go to [email or web address] - password [facility]  [facility] -  Office  [phone]

## 2015-11-25 NOTE — Progress Notes (Signed)
Patient was educated on fall and safety plan. Patient refused to have bed alarm on. [facility] [employer]

## 2015-11-25 NOTE — Progress Notes (Signed)
Patient is now in a NSR verified by EKG and a text page was sent to Dr. Rogue Bussing updating him on VS, her rhythm and her pauses prior to converting to NSR. Patient is on a Cardizem drip at 5mg /hr and was turned down to 2.5 mg/hr and MD was okay with her being on that so that she will hopefully stay in NSR, will continue to monitor.

## 2015-11-25 NOTE — Progress Notes (Signed)
Pt ambulated 200 feet in hallway using a walker; fairly tolerated. No report of dizziness but headache upon returned to room, Maintained SR in 70"s while ambulating, oxygen saturations 98%-1005 on room air. Sitting up in char for meals.

## 2015-11-26 ENCOUNTER — Observation Stay (HOSPITAL_BASED_OUTPATIENT_CLINIC_OR_DEPARTMENT_OTHER): Payer: Medicare Other

## 2015-11-26 ENCOUNTER — Other Ambulatory Visit (HOSPITAL_COMMUNITY): Payer: Medicare Other

## 2015-11-26 DIAGNOSIS — I10 Essential (primary) hypertension: Secondary | ICD-10-CM

## 2015-11-26 DIAGNOSIS — I509 Heart failure, unspecified: Secondary | ICD-10-CM | POA: Diagnosis not present

## 2015-11-26 DIAGNOSIS — I4891 Unspecified atrial fibrillation: Secondary | ICD-10-CM | POA: Diagnosis not present

## 2015-11-26 LAB — CBC
HEMATOCRIT: 31.1 % — AB (ref 36.0–46.0)
HEMOGLOBIN: 10.4 g/dL — AB (ref 12.0–15.0)
MCH: 30.3 pg (ref 26.0–34.0)
MCHC: 33.4 g/dL (ref 30.0–36.0)
MCV: 90.7 fL (ref 78.0–100.0)
Platelets: 116 10*3/uL — ABNORMAL LOW (ref 150–400)
RBC: 3.43 MIL/uL — AB (ref 3.87–5.11)
RDW: 13.5 % (ref 11.5–15.5)
WBC: 10.6 10*3/uL — ABNORMAL HIGH (ref 4.0–10.5)

## 2015-11-26 LAB — ECHOCARDIOGRAM COMPLETE
CHL CUP TV REG PEAK VELOCITY: 345 cm/s
E decel time: 176 msec
E/e' ratio: 7.03
FS: 31 % (ref 28–44)
HEIGHTINCHES: 63 in
IV/PV OW: 1.24
LA ID, A-P, ES: 31 mm
LA diam end sys: 31 mm
LADIAMINDEX: 1.98 cm/m2
LAVOL: 40.8 mL
LAVOLA4C: 46.4 mL
LAVOLIN: 26.1 mL/m2
LV E/e' medial: 7.03
LV PW d: 8.81 mm — AB (ref 0.6–1.1)
LVEEAVG: 7.03
LVELAT: 10 cm/s
LVOT area: 2.54 cm2
LVOT diameter: 18 mm
MV Dec: 176
MVPKAVEL: 58.2 m/s
MVPKEVEL: 70.3 m/s
P 1/2 time: 366 ms
TDI e' lateral: 10
TDI e' medial: 8.38
TR max vel: 345 cm/s
WEIGHTICAEL: 1939.2 [oz_av]

## 2015-11-26 LAB — T4, FREE: Free T4: 1.17 ng/dL — ABNORMAL HIGH (ref 0.61–1.12)

## 2015-11-26 MED ORDER — APIXABAN 5 MG PO TABS: 5.0000 mg | ORAL_TABLET | Freq: Two times a day (BID) | ORAL | Status: DC

## 2015-11-26 MED ORDER — METOPROLOL SUCCINATE ER 50 MG PO TB24: 50.0000 mg | ORAL_TABLET | Freq: Every day | ORAL | Status: AC

## 2015-11-26 MED ORDER — APIXABAN 5 MG PO TABS
5.0000 mg | ORAL_TABLET | Freq: Two times a day (BID) | ORAL | Status: DC
  Administered 2015-11-26: 5 mg via ORAL
  Filled 2015-11-26: qty 1

## 2015-11-26 MED ORDER — ONDANSETRON HCL 4 MG PO TABS: 4.0000 mg | ORAL_TABLET | Freq: Three times a day (TID) | ORAL | Status: DC | PRN

## 2015-11-26 NOTE — Progress Notes (Signed)
Patient Name: Sherri Tucker Date of Encounter: 11/26/2015   Primary Cardiologist: New - Dr. Johnsie Cancel Patient Profile: Sherri Tucker is a 81 year old female with a past medical history of HTN. She has had one previous episode of Afib following hip surgery in 2016. Presented to ED on 11/25/15 with weakness and lightheadedness after 3 days of GI illness with nausea and vomiting. She was found to be in Afib RVR.   SUBJECTIVE: Still nauseous and having some intermittent vomiting. Denies chest pain, denies palpitations.    OBJECTIVE Filed Vitals:   11/26/15 0037 11/26/15 0400 11/26/15 0600 11/26/15 0801  BP: 146/63 137/63 137/66   Pulse: 66 63 56   Temp: 98 F (36.7 C) 98.5 F (36.9 C)  98.6 F (37 C)  TempSrc: Oral Oral    Resp: 15 11 13    Height:      Weight:  121 lb 3.2 oz (54.976 kg)    SpO2: 97% 97% 94%     Intake/Output Summary (Last 24 hours) at 11/26/15 0814 Last data filed at 11/26/15 0800  Gross per 24 hour  Intake   2440 ml  Output    900 ml  Net   1540 ml   Filed Weights   11/24/15 1649 11/25/15 0046 11/26/15 0400  Weight: 117 lb (53.071 kg) 114 lb (51.71 kg) 121 lb 3.2 oz (54.976 kg)    PHYSICAL EXAM General: Well developed, well nourished, female in no acute distress. Head: Normocephalic, atraumatic.  Neck: Supple without bruits, no JVD. Lungs:  Resp regular and unlabored, CTA. Heart: RRR, S1, S2, no S3, S4, or murmur; no rub. Abdomen: Soft, non-tender, non-distended, BS + x 4.  Extremities: No clubbing, cyanosis, no edema.  Neuro: Alert and oriented X 3. Moves all extremities spontaneously. Psych: Normal affect.  LABS: CBC: Recent Labs  11/25/15 0324 11/26/15 0509  WBC 11.7* 10.6*  HGB 11.5* 10.4*  HCT 35.4* 31.1*  MCV 92.4 90.7  PLT 187 116*   INR: Recent Labs  11/24/15 2300  INR [id number]   Basic Metabolic Panel: Recent Labs  11/24/15 1658 11/25/15 0324  NA 136 136  K 3.4* 3.7  CL 102 104  CO2 20* 21*  GLUCOSE 172* 147*  BUN 23*  23*  CREATININE 1.01* 0.94  CALCIUM 10.3 9.1   Liver Function Tests: Recent Labs  11/24/15 1658  AST 22  ALT 12*  ALKPHOS 63  BILITOT 0.7  PROT 7.9  ALBUMIN 4.1   Cardiac Enzymes: Recent Labs  11/24/15 2300  TROPONINI 0.03   Thyroid Function Tests: Recent Labs  11/25/15 0852  TSH 0.212*     Current facility-administered medications:  .  acetaminophen (TYLENOL) tablet 650 mg, 650 mg, Oral, BID PRN, Etta Quill, DO, 650 mg at 11/25/15 1630 .  aspirin chewable tablet 81 mg, 81 mg, Oral, Daily, Etta Quill, DO, 81 mg at 11/25/15 1005 .  cholecalciferol (VITAMIN D) tablet 1,000 Units, 1,000 Units, Oral, Daily, Etta Quill, DO, 1,000 Units at 11/25/15 1005 .  [COMPLETED] diltiazem (CARDIZEM) 1 mg/mL load via infusion 10 mg, 10 mg, Intravenous, Once, 10 mg at 11/24/15 2248 **AND** diltiazem (CARDIZEM) 100 mg in dextrose 5 % 100 mL (1 mg/mL) infusion, 5-15 mg/hr, Intravenous, Continuous, Charlesetta Shanks, MD, Last Rate: 2.5 mL/hr at 11/25/15 0300, 2.5 mg/hr at 11/25/15 0300 .  enoxaparin (LOVENOX) injection 55 mg, 55 mg, Subcutaneous, Q12H, Franky Macho, RPH, 55 mg at 11/25/15 2121 .  omega-3 acid ethyl esters (  LOVAZA) capsule 1,000 mg, 1,000 mg, Oral, QPM, Etta Quill, DO, 1,000 mg at 11/25/15 1813 .  ondansetron (ZOFRAN) injection 4 mg, 4 mg, Intravenous, Q6H PRN, Etta Quill, DO, 4 mg at 11/26/15 0345 .  sertraline (ZOLOFT) tablet 25 mg, 25 mg, Oral, QHS, Etta Quill, DO, 25 mg at 11/25/15 2121 .  traMADol (ULTRAM) tablet 50 mg, 50 mg, Oral, Daily PRN, Etta Quill, DO, 50 mg at 11/25/15 0620 . diltiazem (CARDIZEM) infusion 2.5 mg/hr (11/25/15 0300)   TELE: NSR     ECG: NSR  Radiology/Studies: Dg Chest Port 1 View  11/24/2015  CLINICAL DATA:  Atrial fibrillation. Vomiting and diarrhea. Weakness. EXAM: PORTABLE CHEST 1 VIEW COMPARISON:  09/13/2014 FINDINGS: Lower lung volumes from prior exam. Heart at the upper limits normal size, unchanged allowing  for differences in technique. There is atherosclerosis of the thoracic aorta. No evidence of pulmonary edema, focal airspace disease, large pleural effusion or pneumothorax. Unchanged osseous structures. IMPRESSION: No active disease. Electronically Signed   By: [name] M.D.   On: 11/24/2015 22:23     Current Medications:  . aspirin  81 mg Oral Daily  . cholecalciferol  1,000 Units Oral Daily  . enoxaparin (LOVENOX) injection  55 mg Subcutaneous Q12H  . omega-3 acid ethyl esters  1,000 mg Oral QPM  . sertraline  25 mg Oral QHS   . diltiazem (CARDIZEM) infusion 2.5 mg/hr (11/25/15 0300)    ASSESSMENT AND PLAN: Principal Problem:   Atrial fibrillation with RVR (HCC) Active Problems:   Nausea vomiting and diarrhea   HTN (hypertension)  1. Atrial fibrillation with RVR: Converted to NSR with 10mg  IV diltiazem. Was on Metoprolol 150mg  XL, this was discontinued yesterday for bradycardia. Patient is unsure if she wants to take Eliquis. Says she will think about it today and decide. I discussed with her the benefits of anticoagulation in preventing CVA.    This patients CHA2DS2-VASc Score and unadjusted Ischemic Stroke Rate (% per year) is equal to 4.8 % stroke rate/year from a score of 45 (age, female and HTN). Above score calculated as 1 point each if present [CHF, HTN, DM, Vascular=MI/PAD/Aortic Plaque, Age if 65-74, or Female], 2 points each if present [Age > 75, or Stroke/TIA/TE]   2. HTN: BP elevated, no beta blocker due to bradycardia. Can add lisinopril 2.5mg  for better control.  Creatinine is 0.94.   3. Lateral Q waves on EKG: Denies ever having any chest pain. Echo pending for today. Will follow.     Signed, Arbutus Leas , NP 8:14 AM 11/26/2015 Pager [phone]   I have examined the patient and reviewed assessment and plan and discussed with patient.  Agree with above as stated.  In NSR now.  She is thinking about anticoagulation.  I recommended this due to her  CHADS-vasc score as noted above.  SHe is still nauseated.   Larae Grooms

## 2015-11-26 NOTE — Care Management Obs Status (Signed)
[place] NOTIFICATION   Patient Details  Name: Sherri Tucker MRN: [id number] Date of Birth: [birth year]   Medicare Observation Status Notification Given:  Yes (Atrial Fib)    Bethena Roys, RN 11/26/2015, 11:06 AM

## 2015-11-26 NOTE — Discharge Instructions (Signed)
Follow with Primary MD WEBB, CAROL D, MD in 7 days   Get CBC, CMP, TSH, T3, free T4 ray checked  by Primary MD next visit.    Activity: As tolerated with Full fall precautions use walker/cane & assistance as needed   Disposition Home     Diet:   Heart Healthy    For Heart failure patients - Check your Weight same time everyday, if you gain over 2 pounds, or you develop in leg swelling, experience more shortness of breath or chest pain, call your Primary MD immediately. Follow Cardiac Low Salt Diet and 1.5 lit/day fluid restriction.   On your next visit with your primary care physician please Get Medicines reviewed and adjusted.   Please request your Prim.MD to go over all Hospital Tests and Procedure/Radiological results at the follow up, please get all Hospital records sent to your Prim MD by signing hospital release before you go home.   If you experience worsening of your admission symptoms, develop shortness of breath, life threatening emergency, suicidal or homicidal thoughts you must seek medical attention immediately by calling 911 or calling your MD immediately  if symptoms less severe.  You Must read complete instructions/literature along with all the possible adverse reactions/side effects for all the Medicines you take and that have been prescribed to you. Take any new Medicines after you have completely understood and accpet all the possible adverse reactions/side effects.   Do not drive, operate heavy machinery, perform activities at heights, swimming or participation in water activities or provide baby sitting services if your were admitted for syncope or siezures until you have seen by Primary MD or a Neurologist and advised to do so again.  Do not drive when taking Pain medications.    Do not take more than prescribed Pain, Sleep and Anxiety Medications  Special Instructions: If you have smoked or chewed Tobacco  in the last 2 yrs please stop smoking, stop any regular  Alcohol  and or any Recreational drug use.  Wear Seat belts while driving.   Please note  You were cared for by a hospitalist during your hospital stay. If you have any questions about your discharge medications or the care you received while you were in the hospital after you are discharged, you can call the unit and asked to speak with the hospitalist on call if the hospitalist that took care of you is not available. Once you are discharged, your primary care physician will handle any further medical issues. Please note that NO REFILLS for any discharge medications will be authorized once you are discharged, as it is imperative that you return to your primary care physician (or establish a relationship with a primary care physician if you do not have one) for your aftercare needs so that they can reassess your need for medications and monitor your lab values.

## 2015-11-26 NOTE — Care Management Note (Signed)
Case Management Note  Patient Details  Name: Sherri Tucker MRN: [id number] Date of Birth: 03-Jul-1934  Subjective/Objective:   Pt in for Atrial Fib RVR. Pt is from home and plan is to return home once stable.                  Action/Plan: CM did provide pt with the 30 day free card for Eliquis- Pt uses [facility] on [address]. CM did call and the medication is available. Pharmacy to make pt aware of the cost for the following month. No further needs from CM at this time.   Expected Discharge Date:                  Expected Discharge Plan:  Home/Self Care  In-House Referral:  NA  Discharge planning Services  CM Consult, Medication Assistance  Post Acute Care Choice:  NA Choice offered to:  NA  DME Arranged:  [name] DME Agency:  NA  HH Arranged:  NA HH Agency:  NA  Status of Service:  Completed, signed off  Medicare Important Message Given:    Date Medicare IM Given:    Medicare IM give by:    Date Additional Medicare IM Given:    Additional Medicare Important Message give by:     If discussed at [place] of Stay Meetings, dates discussed:    Additional Comments:  Bethena Roys, RN 11/26/2015, 10:57 AM

## 2015-11-26 NOTE — Progress Notes (Signed)
Echocardiogram 2D Echocardiogram has been performed.  Sherri Tucker 11/26/2015, 11:47 AM

## 2015-11-26 NOTE — Discharge Summary (Signed)
Sherri Tucker, is a 81 y.o. female  DOB 10-Oct-1934  MRN [id number].  Admission date:  11/24/2015  Admitting Physician  Etta Quill, DO  Discharge Date:  11/26/2015   Primary MD  [name], MD  Recommendations for primary care physician for things to follow:   Monitor blood pressure and heart rate, repeat TSH, T3 and free T4 within a week.   Admission Diagnosis  Gastroenteritis [K52.9] Atrial fibrillation with rapid ventricular response ([facility]) [I48.91]   Discharge Diagnosis  Gastroenteritis [K52.9] Atrial fibrillation with rapid ventricular response ([facility]) [I48.91]    Principal Problem:   Atrial fibrillation with RVR (H[facility]) Active Problems:   Nausea vomiting and diarrhea   HTN (hypertension)      Past Medical History  Diagnosis Date  . Cancer ([facility])   . Hemorrhoids   . Colon polyp   . Arthritis   . Hypertension   . Breast cancer, stage 1 ([facility]) 12/04/2011  . Arthritis 12/04/2011  . History of radiation therapy 12/04/2011  . Complication of anesthesia   . PONV (postoperative nausea and vomiting)   . Depression   . History of kidney stones     Past Surgical History  Procedure Laterality Date  . Abdominal hysterectomy    . Breast surgery  11    left lumpectomy  . Total hip arthroplasty Left 09/25/2014    Procedure: TOTAL HIP ARTHROPLASTY;  Surgeon: [name], MD;  Location: [facility];  Service: Orthopedics;  Laterality: Left;       HPI  from the history and physical done on the day of admission:    Sherri Tucker is a 81 y.o. female with medical history significant of A.Fib once before peri-op for a hip replacement, HTN. Patient presents to the ED with c/o generalized weakness, light headedness, and 1 day history of N/V/D that has been persistent. No syncopal episode, no CP, no SOB, no known  sick contacts. Lightheadedness occurs on standing. Diarrhea and N/V have actually been improving.  ED Course: Found to have A.Fib RVR     Hospital Course:     1.Paroxysmal atrial fibrillation with RVR [place] vasc 2 score of at least 4. Briefly placed on Cardizem drip, now in sinus, Cardizem drip will be stopped, Was seen by cardiology, she has agreed for Eliquis for stroke prophylaxis, she was briefly placed on Toprol-XL 150 but she became bradycardic, I discussed her case with Dr.varanasi cardiologist today she will be discharged home on Toprol-XL 50 mg along with liquids. Echogram unremarkable as below **, TSH was slightly low could be sick euthyroid will request primary care physician to repeat TSH, free T4 and T3 in a week.  2. Recent gastroenteritis. Possibly causing mild dehydration and #1 above, hydrated, tolerating diet, minimal nausea will be discharged on Zofran as needed, ambulated 200 yards in the hallway.  3. Essential hypertension. Continue her other home medications, home dose beta blocker has been dropped due to few episodes of bradycardia, request PCP to monitor blood pressure and heart rate.  Follow UP  Follow-up Information    Follow up with WEBB, CAROL D, MD. Schedule an appointment as soon as possible for a visit in 1 week.   Specialty:  Family Medicine   Contact information:   [address] [place] [address] [phone]       Follow up with [name], MD. Schedule an appointment as soon as possible for a visit in 2 weeks.   Specialty:  Cardiology   Contact information:   [id number] [place] [address] [phone]        Consults obtained - cards  Discharge Condition: Fair  Diet and Activity recommendation: See Discharge Instructions below  Discharge Instructions       Discharge Instructions    Diet - low sodium heart healthy    Complete by:  As directed      Discharge instructions    Complete  by:  As directed   Follow with Primary MD WEBB, CAROL D, MD in 7 days   Get CBC, CMP, TSH, T3, free T4 ray checked  by Primary MD next visit.    Activity: As tolerated with Full fall precautions use walker/cane & assistance as needed   Disposition Home     Diet:   Heart Healthy    For Heart failure patients - Check your Weight same time everyday, if you gain over 2 pounds, or you develop in leg swelling, experience more shortness of breath or chest pain, call your Primary MD immediately. Follow Cardiac Low Salt Diet and 1.5 lit/day fluid restriction.   On your next visit with your primary care physician please Get Medicines reviewed and adjusted.   Please request your Prim.MD to go over all Hospital Tests and Procedure/Radiological results at the follow up, please get all Hospital records sent to your Prim MD by signing hospital release before you go home.   If you experience worsening of your admission symptoms, develop shortness of breath, life threatening emergency, suicidal or homicidal thoughts you must seek medical attention immediately by calling 911 or calling your MD immediately  if symptoms less severe.  You Must read complete instructions/literature along with all the possible adverse reactions/side effects for all the Medicines you take and that have been prescribed to you. Take any new Medicines after you have completely understood and accpet all the possible adverse reactions/side effects.   Do not drive, operate heavy machinery, perform activities at heights, swimming or participation in water activities or provide baby sitting services if your were admitted for syncope or siezures until you have seen by Primary MD or a Neurologist and advised to do so again.  Do not drive when taking Pain medications.    Do not take more than prescribed Pain, Sleep and Anxiety Medications  Special Instructions: If you have smoked or chewed Tobacco  in the last 2 yrs please stop  smoking, stop any regular Alcohol  and or any Recreational drug use.  Wear Seat belts while driving.   Please note  You were cared for by a hospitalist during your hospital stay. If you have any questions about your discharge medications or the care you received while you were in the hospital after you are discharged, you can call the unit and asked to speak with the hospitalist on call if the hospitalist that took care of you is not available. Once you are discharged, your primary care physician will handle any further medical issues. Please note that NO REFILLS  for any discharge medications will be authorized once you are discharged, as it is imperative that you return to your primary care physician (or establish a relationship with a primary care physician if you do not have one) for your aftercare needs so that they can reassess your need for medications and monitor your lab values.     Increase activity slowly    Complete by:  As directed              Discharge Medications       Medication List    STOP taking these medications        aspirin 81 MG tablet      TAKE these medications        acetaminophen 650 MG CR tablet  Commonly known as:  TYLENOL  Take 1,300 mg by mouth daily as needed for pain.     apixaban 5 MG Tabs tablet  Commonly known as:  ELIQUIS  Take 1 tablet (5 mg total) by mouth 2 (two) times daily. Hospital providing 1 months coupon, after 1 month PCP to supply further refills.     GLUCOSAMINE 1500 COMPLEX Caps  Take 1 capsule by mouth daily.     hydrochlorothiazide 25 MG tablet  Commonly known as:  HYDRODIURIL  Take 25 mg by mouth daily.     lisinopril 10 MG tablet  Commonly known as:  PRINIVIL,ZESTRIL  Take 10 mg by mouth daily.     metoprolol succinate 50 MG 24 hr tablet  Commonly known as:  TOPROL-XL  Take 1 tablet (50 mg total) by mouth daily.     Omega 3 1200 MG Caps  Take 1,200 mg by mouth every evening.     ondansetron 4 MG tablet    Commonly known as:  ZOFRAN  Take 1 tablet (4 mg total) by mouth every 8 (eight) hours as needed for nausea or vomiting.     sertraline 50 MG tablet  Commonly known as:  ZOLOFT  Take 25 mg by mouth at bedtime. Patient takes 1/2 tablet daily. Only taking 25 mg total a day.     traMADol 50 MG tablet  Commonly known as:  ULTRAM  Take 50 mg by mouth daily as needed.     Vitamin D-3 1000 units Caps  Take 1,000 Units by mouth daily.        Major procedures and Radiology Reports - PLEASE review detailed and final reports for all details, in brief -   TTE  Left ventricle: The cavity size was normal. There was mild focal basal hypertrophy of the septum. Systolic function was normal.  The estimated ejection fraction was in the range of 55% to 60%. Wall motion was normal; there were no regional wall motion abnormalities. Left ventricular diastolic function parameters  were normal. - Aortic valve: Transvalvular velocity was within the normal range.  There was no stenosis. There was moderate regurgitation.  Regurgitation pressure half-time: 366 ms. - Mitral valve: Transvalvular velocity was within the normal range.  There was no evidence for stenosis. There was mild regurgitation. - Right ventricle: The cavity size was mildly dilated. Wall thickness was normal. Systolic function was normal. - Atrial septum: No defect or patent foramen ovale was identified by color flow Doppler. - Tricuspid valve: There was moderate regurgitation. - Pulmonary arteries: Systolic pressure was severely increased. PA peak pressure: 63 mm Hg (S).    Dg Chest Port 1 View  11/24/2015  CLINICAL DATA:  Atrial fibrillation. Vomiting and diarrhea. Weakness.  EXAM: PORTABLE CHEST 1 VIEW COMPARISON:  09/13/2014 FINDINGS: Lower lung volumes from prior exam. Heart at the upper limits normal size, unchanged allowing for differences in technique. There is atherosclerosis of the thoracic aorta. No evidence of pulmonary edema, focal  airspace disease, large pleural effusion or pneumothorax. Unchanged osseous structures. IMPRESSION: No active disease. Electronically Signed   By: [name] M.D.   On: 11/24/2015 22:23    Micro Results      Recent Results (from the past 240 hour(s))  MRSA PCR Screening     Status: None   Collection Time: 11/24/15 11:50 PM  Result Value Ref Range Status   MRSA by PCR NEGATIVE NEGATIVE Final    Comment:        The GeneXpert MRSA Assay (FDA approved for NASAL specimens only), is one component of a comprehensive MRSA colonization surveillance program. It is not intended to diagnose MRSA infection nor to guide or monitor treatment for MRSA infections.        Today   Subjective    Sherri Tucker today has no headache,no chest abdominal pain,no new weakness tingling or numbness, feels much better wants to go home today.     Objective   Blood pressure 135/61, pulse 67, temperature 98.6 F (37 C), temperature source Oral, resp. rate 15, height 5\' 3"  (1.6 m), weight 54.976 kg (121 lb 3.2 oz), SpO2 94 %.   Intake/Output Summary (Last 24 hours) at 11/26/15 1011 Last data filed at 11/26/15 0800  Gross per 24 hour  Intake   2200 ml  Output    900 ml  Net   1300 ml    Exam Awake Alert, Oriented x 3, No new F.N deficits, Normal affect Murray.AT,PERRAL Supple Neck,No JVD, No cervical lymphadenopathy appriciated.  Symmetrical Chest wall movement, Good air movement bilaterally, CTAB RRR,No Gallops,Rubs or new Murmurs, No Parasternal Heave +ve B.Sounds, Abd Soft, Non tender, No organomegaly appriciated, No rebound -guarding or rigidity. No Cyanosis, Clubbing or edema, No new Rash or bruise   Data Review   CBC w Diff: Lab Results  Component Value Date   WBC 10.6* 11/26/2015   WBC 6.1 12/04/2011   HGB 10.4* 11/26/2015   HGB 13.8 12/04/2011   HCT 31.1* 11/26/2015   HCT 40.4 12/04/2011   PLT 116* 11/26/2015   PLT 197 12/04/2011   LYMPHOPCT 14 09/13/2014   LYMPHOPCT  17.6 12/04/2011   MONOPCT 7 09/13/2014   MONOPCT 7.4 12/04/2011   EOSPCT 1 09/13/2014   EOSPCT 1.3 12/04/2011   BASOPCT 1 09/13/2014   BASOPCT 1.2 12/04/2011    CMP: Lab Results  Component Value Date   NA 136 11/25/2015   K 3.7 11/25/2015   CL 104 11/25/2015   CO2 21* 11/25/2015   BUN 23* 11/25/2015   CREATININE 0.94 11/25/2015   PROT 7.9 11/24/2015   ALBUMIN 4.1 11/24/2015   BILITOT 0.7 11/24/2015   ALKPHOS 63 11/24/2015   AST 22 11/24/2015   ALT 12* 11/24/2015  . Lab Results  Component Value Date   TSH 0.212* 11/25/2015     Total Time in preparing paper work, data evaluation and todays exam - 35 minutes  Thurnell Lose M.D on 11/26/2015 at 10:11 AM  [facility]   Office  [phone]

## 2015-11-26 NOTE — Progress Notes (Signed)
ANTICOAGULATION CONSULT NOTE - Initial Consult  Pharmacy Consult for Eliquis Indication: atrial fibrillation  Allergies  Allergen Reactions  . Codeine Nausea And Vomiting    Patient Measurements: Height: 5\' 3"  (160 cm) Weight: 121 lb 3.2 oz (54.976 kg) IBW/kg (Calculated) : 52.4  Vital Signs: Temp: 98.6 F (37 C) (06/19 0801) Temp Source: Oral (06/19 0400) BP: 135/61 mmHg (06/19 0800) Pulse Rate: 67 (06/19 0800)  Labs:  Recent Labs  11/24/15 1658 11/24/15 2300 11/25/15 0324 11/26/15 0509  HGB 12.9  --  11.5* 10.4*  HCT 38.8  --  35.4* 31.1*  PLT 242  --  187 116*  LABPROT  --  15.9*  --   --   INR  --  1.25  --   --   CREATININE 1.01*  --  0.94  --   TROPONINI  --  0.03  --   --     Estimated Creatinine Clearance: 40.8 mL/min (by C-G formula based on Cr of 0.94).   Medical History: Past Medical History  Diagnosis Date  . Cancer ([facility])   . Hemorrhoids   . Colon polyp   . Arthritis   . Hypertension   . Breast cancer, stage 1 ([facility]) 12/04/2011  . Arthritis 12/04/2011  . History of radiation therapy 12/04/2011  . Complication of anesthesia   . PONV (postoperative nausea and vomiting)   . Depression   . History of kidney stones     Assessment: 81 y.o. Presented to ED on 11/25/15 with weakness and lightheadedness after 3 days of GI illness with nausea and vomiting. She was found to be in Afib RVR. Pt with afib once before peri-op for hip replacement in 2016. CBC stable.   PMH: HTN, ca, h/o breast ca, depression  Anticoagulation/heme: Lovenox>Eliquis for afib. Pt with afib once before peri-op for hip replacement 2016. CBC stable. CHADSvasc score at least 4. Not on AC PTA  Goal of Therapy:  Therapeutic oral anticoagulation Monitor platelets by anticoagulation protocol: Yes   Plan:  D/c Lovenox Eliquis 5mg  BID   Terrionna Bridwell S. Alford Highland, PharmD, BCPS Clinical Staff Pharmacist Pager [phone]  Eilene Ghazi Stillinger 11/26/2015,9:39 AM

## 2015-11-27 LAB — T3: T3, Total: 70 ng/dL — ABNORMAL LOW (ref 71–180)

## 2015-12-19 ENCOUNTER — Ambulatory Visit: Payer: Medicare Other | Admitting: Physician Assistant

## 2015-12-31 NOTE — Progress Notes (Signed)
Cardiology Office Note    Date:  01/02/2016   ID:  Sherri Tucker, DOB 21-Dec-1934, MRN [id number]  PCP:  [name], MD  Cardiologist:  Dr. Johnsie Cancel   CC: post hospital f/u- afib  History of Present Illness:  Sherri Tucker is a 81 y.o. female with a history of HTN, breast cancer, and PAF who presents for post hospital follow up.   She had one episode of isolated afib after a hip replacement in 09/2014.   She was recently admitted from 6/18-6/19/17 for GI illness with N/V and afib with RVR. She spontaneously converted back into NSR on IV dilt. She was converted to Toprol-XL 150 but she became bradycardic and this was discontinued. She was discharged on Toprol XL 50mg  daily and Eliquis 5mg  BID for CHADSVASC score of at least 4 (HTN, age, F sex). 2D ECHO showed normal LV function with no WMAs, mod AR, mild MR, mod TR and PA pressure 63.   Today she presents to clinic for follow up. She has been feeling much better since leaving the hospital. She lost 7 lbs in the hospital. Her appetite is now better but she feels like she hasn't gained any weight back. She has been drinking boost. No chest pain or SOB. She does feel her afib. She did have an hour or so of palpitations. By the time she got to Dr. Layla Barter office it has resolved. She does have some dizziness when she stands.  Dr. Justin Mend noted her K was low and started her on Kdur. She asks if we can check her electrolytes today to save her a trip back. She is getting a hip injection and didn't know if she had to hold Eliquis.   Past Medical History:  Diagnosis Date  . Arthritis   . Arthritis 12/04/2011  . Breast cancer, stage 1 ([facility]) 12/04/2011  . Cancer ([facility])   . Colon polyp   . Complication of anesthesia   . Depression   . Hemorrhoids   . History of kidney stones   . History of radiation therapy 12/04/2011  . Hypertension   . PONV (postoperative nausea and vomiting)     Past Surgical History:  Procedure Laterality Date  . ABDOMINAL  HYSTERECTOMY    . BREAST SURGERY  11   left lumpectomy  . TOTAL HIP ARTHROPLASTY Left 09/25/2014   Procedure: TOTAL HIP ARTHROPLASTY;  Surgeon: [name], MD;  Location: [facility];  Service: Orthopedics;  Laterality: Left;    Current Medications: Outpatient Medications Prior to Visit  Medication Sig Dispense Refill  . acetaminophen (TYLENOL) 650 MG CR tablet Take 1,300 mg by mouth daily as needed for pain.    . Cholecalciferol (VITAMIN D-3) 1000 UNITS CAPS Take 1,000 Units by mouth daily.    . Glucosamine-Chondroit-Vit C-Mn (GLUCOSAMINE 1500 COMPLEX) CAPS Take 1 capsule by mouth daily.    . hydrochlorothiazide (HYDRODIURIL) 25 MG tablet Take 25 mg by mouth daily.      Marland Kitchen lisinopril (PRINIVIL,ZESTRIL) 10 MG tablet Take 10 mg by mouth daily.      . metoprolol succinate (TOPROL-XL) 50 MG 24 hr tablet Take 1 tablet (50 mg total) by mouth daily. 30 tablet 0  . Omega 3 1200 MG CAPS Take 1,200 mg by mouth every evening.    . sertraline (ZOLOFT) 50 MG tablet Take 25 mg by mouth at bedtime. Patient takes 1/2 tablet daily. Only taking 25 mg total a day.    . traMADol (ULTRAM) 50 MG tablet Take  50 mg by mouth daily as needed.  3  . apixaban (ELIQUIS) 5 MG TABS tablet Take 1 tablet (5 mg total) by mouth 2 (two) times daily. Hospital providing 1 months coupon, after 1 month PCP to supply further refills. 60 tablet 0  . ondansetron (ZOFRAN) 4 MG tablet Take 1 tablet (4 mg total) by mouth every 8 (eight) hours as needed for nausea or vomiting. 20 tablet 0   No facility-administered medications prior to visit.      Allergies:   Codeine   Social History   Social History  . Marital status: Widowed    Spouse name: Sherri Tucker  . Number of children: Sherri Tucker  . Years of education: Sherri Tucker   Social History Main Topics  . Smoking status: Never Smoker  . Smokeless tobacco: Never Used  . Alcohol use No  . Drug use: No  . Sexual activity: Not Currently   Other Topics Concern  . None   Social History Narrative  .  None     Family History:  The patient's family history includes Heart disease in her sister.     ROS:   Please see the history of present illness.    ROS All other systems reviewed and are negative.   PHYSICAL EXAM:   VS:  BP 114/68 (BP Location: Right Arm, Patient Position: Sitting, Cuff Size: Normal)   Pulse 69   Ht 5\' 3"  (1.6 m)   Wt 109 lb 1.9 oz (49.5 kg)   BMI 19.33 kg/m    GEN: Well nourished, well developed, in no acute distress  HEENT: normal  Neck: no JVD, carotid bruits, or masses Cardiac: RRR; no murmurs, rubs, or gallops,no edema  Respiratory:  clear to auscultation bilaterally, normal work of breathing GI: soft, nontender, nondistended, + BS MS: no deformity or atrophy  Skin: warm and dry, no rash Neuro:  Alert and Oriented x 3, Strength and sensation are intact Psych: euthymic mood, full affect  Wt Readings from Last 3 Encounters:  01/02/16 109 lb 1.9 oz (49.5 kg)  11/26/15 121 lb 3.2 oz (55 kg)  03/05/15 117 lb 9.6 oz (53.3 kg)      Studies/Labs Reviewed:   EKG:  EKG is ordered today.  The ekg ordered today demonstrates NSR HR 69  Recent Labs: 11/24/2015: ALT 12 11/25/2015: BUN 23; Creatinine, Ser 0.94; Potassium 3.7; Sodium 136; TSH 0.212 11/26/2015: Hemoglobin 10.4; Platelets 116   Lipid Panel No results found for: CHOL, TRIG, HDL, CHOLHDL, VLDL, LDLCALC, LDLDIRECT  Additional studies/ records that were reviewed today include:  2D ECHO: 11/26/2015 LV EF: 55% -   60% Study Conclusions - Left ventricle: The cavity size was normal. There was mild focal   basal hypertrophy of the septum. Systolic function was normal.   The estimated ejection fraction was in the range of 55% to 60%.   Wall motion was normal; there were no regional wall motion   abnormalities. Left ventricular diastolic function parameters   were normal. - Aortic valve: Transvalvular velocity was within the normal range.   There was no stenosis. There was moderate regurgitation.    Regurgitation pressure half-time: 366 ms. - Mitral valve: Transvalvular velocity was within the normal range.   There was no evidence for stenosis. There was mild regurgitation. - Right ventricle: The cavity size was mildly dilated. Wall   thickness was normal. Systolic function was normal. - Atrial septum: No defect or patent foramen ovale was identified   by color flow Doppler. - Tricuspid  valve: There was moderate regurgitation. - Pulmonary arteries: Systolic pressure was severely increased. PA peak pressure: 63 mm Hg (S).   ASSESSMENT & PLAN:   PAF: maintaining NSR today. Continue Toprol XL 50mg  daily and Eliquis 5mg  BID for CHADSVASC score of at least 4 (HTN, age, F sex). 2D ECHO showed normal LV function with no WMAs, mod AR, mild MR, mod TR and PA pressure 63.  HTN: BP well controlled on current regimen.   Pulm HTN: Pa pressure 63 on most recent echo  Dizziness: worried that this is caused by Eliquis. I reassured her this was likely due to her recent GI illness and dehydration. Her BP and HR are good today. Will check a CBC to r/o bleeding since she is on Eliquis now.   Hypokalemia: likely related to HCTZ use. Recently started on Kdur by PCP. Will recheck labs today for her convenience and send to PCP.  Medication Adjustments/Labs and Tests Ordered: Current medicines are reviewed at length with the patient today.  Concerns regarding medicines are outlined above.  Medication changes, Labs and Tests ordered today are listed in the Patient Instructions below. There are no Patient Instructions on file for this visit.   Signed, Angelena Form, PA-C  01/02/2016 4:06 PM    [facility] [address], [place], [place]  [address] Phone: [phone]; Fax: [phone]

## 2016-01-02 ENCOUNTER — Encounter (INDEPENDENT_AMBULATORY_CARE_PROVIDER_SITE_OTHER): Payer: Self-pay

## 2016-01-02 ENCOUNTER — Encounter: Payer: Self-pay | Admitting: Physician Assistant

## 2016-01-02 ENCOUNTER — Other Ambulatory Visit: Payer: Self-pay | Admitting: Physician Assistant

## 2016-01-02 ENCOUNTER — Ambulatory Visit (INDEPENDENT_AMBULATORY_CARE_PROVIDER_SITE_OTHER): Payer: Medicare Other | Admitting: Physician Assistant

## 2016-01-02 VITALS — BP 114/68 | HR 69 | Ht 63.0 in | Wt 109.1 lb

## 2016-01-02 DIAGNOSIS — E876 Hypokalemia: Secondary | ICD-10-CM

## 2016-01-02 DIAGNOSIS — I4891 Unspecified atrial fibrillation: Secondary | ICD-10-CM

## 2016-01-02 LAB — CBC
HCT: 40.2 % (ref 35.0–45.0)
Hemoglobin: 13 g/dL (ref 11.7–15.5)
MCH: 30.2 pg (ref 27.0–33.0)
MCHC: 32.3 g/dL (ref 32.0–36.0)
MCV: 93.5 fL (ref 80.0–100.0)
MPV: 11.4 fL (ref 7.5–12.5)
PLATELETS: 252 10*3/uL (ref 140–400)
RBC: 4.3 MIL/uL (ref 3.80–5.10)
RDW: 13.8 % (ref 11.0–15.0)
WBC: 9 10*3/uL (ref 3.8–10.8)

## 2016-01-02 MED ORDER — APIXABAN 5 MG PO TABS: 5.0000 mg | ORAL_TABLET | Freq: Two times a day (BID) | ORAL | 3 refills | Status: DC

## 2016-01-02 NOTE — Patient Instructions (Addendum)
Medication Instructions:  Your physician recommends that you continue on your current medications as directed. Please refer to the Current Medication list given to you today.   Labwork: TODAY:  BMET & CBC  Testing/Procedures: None ordered  Follow-Up: Your physician wants you to follow-up in: [place]. [facility] Cancel.   You will receive a reminder letter in the mail two months in advance. If you don't receive a letter, please call our office to schedule the follow-up appointment.    Any Other Special Instructions Will Be Listed Below (If Applicable).     If you need a refill on your cardiac medications before your next appointment, please call your pharmacy.

## 2016-01-03 ENCOUNTER — Telehealth: Payer: Self-pay | Admitting: *Deleted

## 2016-01-03 LAB — BASIC METABOLIC PANEL
BUN: 23 mg/dL (ref 7–25)
CO2: 26 mmol/L (ref 20–31)
CREATININE: 1.17 mg/dL — AB (ref 0.60–0.93)
Calcium: 9.8 mg/dL (ref 8.6–10.4)
Chloride: 100 mmol/L (ref 98–110)
GLUCOSE: 96 mg/dL (ref 65–99)
Potassium: 3.7 mmol/L (ref 3.5–5.3)
SODIUM: 139 mmol/L (ref 135–146)

## 2016-01-03 NOTE — Telephone Encounter (Signed)
Per Randal Buba, called pt to discuss lab results and she verbalized understanding.

## 2016-04-15 ENCOUNTER — Telehealth: Payer: Self-pay | Admitting: Hematology and Oncology

## 2016-04-15 NOTE — Telephone Encounter (Signed)
Referral sent from [facility] to schedule pt an appt. She's a former pt of Dr. [name], last seen in 2016. Appt scheduled for 11/17 at 2pm. Pt agreed to appt.

## 2016-04-25 ENCOUNTER — Ambulatory Visit (HOSPITAL_BASED_OUTPATIENT_CLINIC_OR_DEPARTMENT_OTHER): Payer: Medicare Other | Admitting: Hematology and Oncology

## 2016-04-25 ENCOUNTER — Encounter: Payer: Self-pay | Admitting: Hematology and Oncology

## 2016-04-25 DIAGNOSIS — C50412 Malignant neoplasm of upper-outer quadrant of left female breast: Secondary | ICD-10-CM

## 2016-04-25 DIAGNOSIS — Z170 Estrogen receptor positive status [ER+]: Secondary | ICD-10-CM

## 2016-04-25 DIAGNOSIS — M25512 Pain in left shoulder: Secondary | ICD-10-CM

## 2016-04-25 NOTE — Progress Notes (Signed)
Patient Care Team: Maurice Small, MD as PCP - General (Family Medicine)  DIAGNOSIS:  Encounter Diagnosis  Name Primary?  . Malignant neoplasm of upper-outer quadrant of left breast in female, estrogen receptor positive ([place])     SUMMARY OF ONCOLOGIC HISTORY:   Breast cancer of upper-outer quadrant of left female breast ([place])   05/23/2010 Surgery    Left breast lumpectomy 1.5 cm grade 1 stage IA T1 C. N0 M0 invasive ductal carcinoma ER/PR positive HER-2 negative Ki-67 80%, 2 SLN negative      06/08/2010 - 08/05/2010 Radiation Therapy    Radiation therapy to the left breast lumpectomy       Anti-estrogen oral therapy    Patient declined antiestrogen therapy       CHIEF COMPLIANT: Left shoulder pain  INTERVAL HISTORY: Sherri Tucker is a 81 year old lady with above-mentioned history left breast cancer treated with lumpectomy and radiation in 2011 and had refused antiestrogen therapy. Over the past 2 months she is having increasing pain in the left shoulder. She had a [facility] of the left [facility] and they identified a soft tissue mass of the left glenohumeral joint measuring 3.8 cm along with increased bone marrow uptake at the humeral head. She has difficulty with range of motion about the shoulder level. Patient had hip replacement surgery and is looking for another hip replacement surgery . Otherwise she has no limitation of her activity. She has great appetite and stable weight.  REVIEW OF SYSTEMS:   Constitutional: Denies fevers, chills or abnormal weight loss Eyes: Denies blurriness of vision Ears, nose, mouth, throat, and face: Denies mucositis or sore throat Respiratory: Denies cough, dyspnea or wheezes Cardiovascular: Denies palpitation, chest discomfort Gastrointestinal:  Denies nausea, heartburn or change in bowel habits Skin: Denies abnormal skin rashes Lymphatics: Denies new lymphadenopathy or easy bruising Neurological:Denies numbness, tingling  or new weaknesses Behavioral/Psych: Mood is stable, no new changes  Extremities: Left shoulder pain  All other systems were reviewed with the patient and are negative.  I have reviewed the past medical history, past surgical history, social history and family history with the patient and they are unchanged from previous note.  ALLERGIES:  is allergic to codeine.  MEDICATIONS:  Current Outpatient Prescriptions  Medication Sig Dispense Refill  . acetaminophen (TYLENOL) 650 MG CR tablet Take 1,300 mg by mouth daily as needed for pain.    Marland Kitchen apixaban (ELIQUIS) 5 MG TABS tablet Take 1 tablet (5 mg total) by mouth 2 (two) times daily. Hospital providing 1 months coupon, after 1 month PCP to supply further refills. 180 tablet 3  . Cholecalciferol (VITAMIN D-3) 1000 UNITS CAPS Take 1,000 Units by mouth daily.    . Glucosamine-Chondroit-Vit C-Mn (GLUCOSAMINE 1500 COMPLEX) CAPS Take 1 capsule by mouth daily.    . hydrochlorothiazide (HYDRODIURIL) 25 MG tablet Take 25 mg by mouth daily.      Marland Kitchen lisinopril (PRINIVIL,ZESTRIL) 10 MG tablet Take 10 mg by mouth daily.      . metoprolol succinate (TOPROL-XL) 50 MG 24 hr tablet Take 1 tablet (50 mg total) by mouth daily. 30 tablet 0  . Omega 3 1200 MG CAPS Take 1,200 mg by mouth every evening.    . sertraline (ZOLOFT) 50 MG tablet Take 25 mg by mouth at bedtime. Patient takes 1/2 tablet daily. Only taking 25 mg total a day.    . traMADol (ULTRAM) 50 MG tablet Take 50 mg by mouth daily as needed.  3   No  current facility-administered medications for this visit.     PHYSICAL EXAMINATION: ECOG PERFORMANCE STATUS: 1 - Symptomatic but completely ambulatory  Vitals:   04/25/16 1349  BP: 124/62  Pulse: 65  Resp: 17  Temp: 97.6 F (36.4 C)   Filed Weights   04/25/16 1349  Weight: 105 lb 11.2 oz (47.9 kg)    GENERAL:alert, no distress and comfortable SKIN: skin color, texture, turgor are normal, no rashes or significant lesions EYES: normal,  Conjunctiva are pink and non-injected, sclera clear OROPHARYNX:no exudate, no erythema and lips, buccal mucosa, and tongue normal  NECK: supple, thyroid normal size, non-tender, without nodularity LYMPH:  no palpable lymphadenopathy in the cervical, axillary or inguinal LUNGS: clear to auscultation and percussion with normal breathing effort HEART: regular rate & rhythm and no murmurs and no lower extremity edema ABDOMEN:abdomen soft, non-tender and normal bowel sounds MUSCULOSKELETAL:no cyanosis of digits and no clubbing  NEURO: alert & oriented x 3 with fluent speech, no focal motor/sensory deficits EXTREMITIES: No lower extremity edema  LABORATORY DATA:  I have reviewed the data as listed   Chemistry      Component Value Date/Time   NA 139 01/02/2016 1625   K 3.7 01/02/2016 1625   CL 100 01/02/2016 1625   CO2 26 01/02/2016 1625   BUN 23 01/02/2016 1625   CREATININE 1.17 (H) 01/02/2016 1625      Component Value Date/Time   CALCIUM 9.8 01/02/2016 1625   ALKPHOS 63 11/24/2015 1658   AST 22 11/24/2015 1658   ALT 12 (L) 11/24/2015 1658   BILITOT 0.7 11/24/2015 1658       Lab Results  Component Value Date   WBC 9.0 01/02/2016   HGB 13.0 01/02/2016   HCT 40.2 01/02/2016   MCV 93.5 01/02/2016   PLT 252 01/02/2016   NEUTROABS 7.0 09/13/2014     ASSESSMENT & PLAN:  Breast cancer of upper-outer quadrant of left female breast Left breast invasive ductal carcinoma T1 C. N0 M0 stage IA grade 1 ER/PR positive HER-2 negative Ki-67 8% 2 SLN negative status post lumpectomy In December 2011 and radiation. Patient refused antiestrogen therapy and is currently on observation.  November 2017 Left shoulder MRI: Increased activity in the left humeral head bone marrow, left glenohumeral soft tissue mass measuring 3.8 cm Is a suspicious for metastatic cancer versus primary bone tumors like chondrosarcoma. I would like to obtain a whole-body PET CT scan in one week and follow-up  afterwards. We will need to obtain a biopsy of the soft tissue mass unless the PET/CT suggests widespread metastatic breast cancer.  Return to clinic after the PET/CT scan to discuss the results and to discuss the follow-up plan. Patient has difficulty with range of motion and pain with elevation arm above the shoulder level. She may need palliative radiation therapy.   No orders of the defined types were placed in this encounter.  The patient has a good understanding of the overall plan. she agrees with it. she will call with any problems that may develop before the next visit here.   Rulon Eisenmenger, MD 04/25/16

## 2016-04-25 NOTE — Assessment & Plan Note (Signed)
Left breast invasive ductal carcinoma T1 C. N0 M0 stage IA grade 1 ER/PR positive HER-2 negative Ki-67 8% 2 SLN negative status post lumpectomy and radiation. Patient refuses antiestrogen therapy and is currently on observation.  Surveillance: She will be set up for a mammogram in October 2016. Today's breast exam did not reveal any palpable nodules or lumps. I recommended that she continue her annual followups with mammograms and breast exams.  Survivorship: I encouraged her to stay more active and do exercise. Stays very active with her church group and is a devote [place]. 30 minutes of exercise every day for 5 days a week is what I recommended her. She does yoga once a week in the church.

## 2016-05-05 ENCOUNTER — Other Ambulatory Visit: Payer: Self-pay

## 2016-05-05 ENCOUNTER — Ambulatory Visit (HOSPITAL_BASED_OUTPATIENT_CLINIC_OR_DEPARTMENT_OTHER): Payer: Medicare Other | Admitting: Nurse Practitioner

## 2016-05-05 ENCOUNTER — Other Ambulatory Visit (HOSPITAL_BASED_OUTPATIENT_CLINIC_OR_DEPARTMENT_OTHER): Payer: Medicare Other

## 2016-05-05 VITALS — BP 154/72 | HR 72 | Temp 98.4°F | Resp 18 | Ht 63.0 in | Wt 106.9 lb

## 2016-05-05 DIAGNOSIS — M549 Dorsalgia, unspecified: Secondary | ICD-10-CM | POA: Diagnosis not present

## 2016-05-05 DIAGNOSIS — E860 Dehydration: Secondary | ICD-10-CM

## 2016-05-05 DIAGNOSIS — C50412 Malignant neoplasm of upper-outer quadrant of left female breast: Secondary | ICD-10-CM | POA: Diagnosis not present

## 2016-05-05 DIAGNOSIS — M5440 Lumbago with sciatica, unspecified side: Secondary | ICD-10-CM

## 2016-05-05 DIAGNOSIS — Z170 Estrogen receptor positive status [ER+]: Principal | ICD-10-CM

## 2016-05-05 DIAGNOSIS — C50411 Malignant neoplasm of upper-outer quadrant of right female breast: Secondary | ICD-10-CM | POA: Diagnosis not present

## 2016-05-05 DIAGNOSIS — E876 Hypokalemia: Secondary | ICD-10-CM

## 2016-05-05 LAB — COMPREHENSIVE METABOLIC PANEL
ALT: 6 U/L (ref 0–55)
AST: 10 U/L (ref 5–34)
Albumin: 3 g/dL — ABNORMAL LOW (ref 3.5–5.0)
Alkaline Phosphatase: 67 U/L (ref 40–150)
Anion Gap: 11 mEq/L (ref 3–11)
BILIRUBIN TOTAL: 0.53 mg/dL (ref 0.20–1.20)
BUN: 21 mg/dL (ref 7.0–26.0)
CO2: 30 meq/L — AB (ref 22–29)
CREATININE: 0.8 mg/dL (ref 0.6–1.1)
Calcium: 9.8 mg/dL (ref 8.4–10.4)
Chloride: 94 mEq/L — ABNORMAL LOW (ref 98–109)
EGFR: 73 mL/min/{1.73_m2} — ABNORMAL LOW (ref >90)
GLUCOSE: 120 mg/dL (ref 70–140)
Potassium: 2.8 mEq/L — CL (ref 3.5–5.1)
SODIUM: 134 meq/L — AB (ref 136–145)
TOTAL PROTEIN: 7.2 g/dL (ref 6.4–8.3)

## 2016-05-05 LAB — CBC WITH DIFFERENTIAL/PLATELET
BASO%: 0.1 % (ref 0.0–2.0)
Basophils Absolute: 0 10*3/uL (ref 0.0–0.1)
EOS%: 2 % (ref 0.0–7.0)
Eosinophils Absolute: 0.3 10*3/uL (ref 0.0–0.5)
HCT: 37.9 % (ref 34.8–46.6)
HGB: 13 g/dL (ref 11.6–15.9)
LYMPH%: 6 % — AB (ref 14.0–49.7)
MCH: 30.8 pg (ref 25.1–34.0)
MCHC: 34.3 g/dL (ref 31.5–36.0)
MCV: 89.8 fL (ref 79.5–101.0)
MONO#: 1.2 10*3/uL — AB (ref 0.1–0.9)
MONO%: 8.2 % (ref 0.0–14.0)
NEUT%: 83.7 % — ABNORMAL HIGH (ref 38.4–76.8)
NEUTROS ABS: 12.1 10*3/uL — AB (ref 1.5–6.5)
PLATELETS: 284 10*3/uL (ref 145–400)
RBC: 4.22 10*6/uL (ref 3.70–5.45)
RDW: 13.3 % (ref 11.2–14.5)
WBC: 14.4 10*3/uL — AB (ref 3.9–10.3)
lymph#: 0.9 10*3/uL (ref 0.9–3.3)

## 2016-05-05 MED ORDER — MORPHINE SULFATE (PF) 4 MG/ML IV SOLN
INTRAVENOUS | Status: AC
  Filled 2016-05-05: qty 1

## 2016-05-05 MED ORDER — ONDANSETRON HCL 4 MG/2ML IJ SOLN
8.0000 mg | Freq: Once | INTRAMUSCULAR | Status: AC
  Administered 2016-05-05: 8 mg via INTRAVENOUS

## 2016-05-05 MED ORDER — MORPHINE SULFATE ER 15 MG PO TBCR: 15.0000 mg | EXTENDED_RELEASE_TABLET | Freq: Two times a day (BID) | ORAL | 0 refills | Status: DC

## 2016-05-05 MED ORDER — MORPHINE SULFATE 4 MG/ML IJ SOLN
2.0000 mg | Freq: Once | INTRAMUSCULAR | Status: AC
  Administered 2016-05-05: 2 mg via INTRAVENOUS
  Filled 2016-05-05: qty 1

## 2016-05-05 MED ORDER — POTASSIUM CHLORIDE CRYS ER 20 MEQ PO TBCR: 20.0000 meq | EXTENDED_RELEASE_TABLET | Freq: Every day | ORAL | 0 refills | Status: DC

## 2016-05-05 MED ORDER — OXYCODONE-ACETAMINOPHEN 5-325 MG PO TABS: 1.0000 | ORAL_TABLET | Freq: Once | ORAL | Status: DC | PRN

## 2016-05-05 MED ORDER — SODIUM CHLORIDE 0.9 % IV SOLN
INTRAVENOUS | Status: AC
  Administered 2016-05-05: 15:00:00 via INTRAVENOUS
  Filled 2016-05-05: qty 1000

## 2016-05-05 MED ORDER — SODIUM CHLORIDE 0.9 % IV SOLN: 8.0000 mg | Freq: Once | INTRAVENOUS | Status: DC

## 2016-05-05 MED ORDER — ONDANSETRON HCL 4 MG/2ML IJ SOLN
INTRAMUSCULAR | Status: AC
  Filled 2016-05-05: qty 4

## 2016-05-05 NOTE — Progress Notes (Signed)
Pt daughter in law called and states that pt has not been eating, drinking and has had severe back pain since wed last week. Pt had a few bites of yogurt this morning but has since been feeling dizzy and lightheaded on/off. Pt scheduled for PET scan on wed for restaging. Pt daughter states that pt has been weak and not hydrating as much. She would like to know if she needs to take her to the ER to get her stronger pain medicine or take her in to see MD. Pt has been taking tylenol extra strength and tramadol 50mg , and still has severe back pain. Discussed with Dr. [name] and will bring pt in for IVF's and obtained orders for lab, nausea, and pain medication. Sent scheduling message to add pt to [facility] schedule today at 11:30am. Instructed daughter in law to check in the pt and wait for labs to be drawn prior to going back to [facility]. Verbalized understanding and has no further questions at this time.

## 2016-05-05 NOTE — Progress Notes (Signed)
SYMPTOM MANAGEMENT CLINIC    Chief Complaint: Back pain, dehydration  HPI:  Sherri Tucker 81 y.o. female diagnosed with recurrent breast cancer.  Currently undergoing observation only.  Awaiting restaging scans.     Breast cancer of upper-outer quadrant of left female breast ([place])   05/23/2010 Surgery    Left breast lumpectomy 1.5 cm grade 1 stage IA T1 C. N0 M0 invasive ductal carcinoma ER/PR positive HER-2 negative Ki-67 80%, 2 SLN negative      06/08/2010 - 08/05/2010 Radiation Therapy    Radiation therapy to the left breast lumpectomy       Anti-estrogen oral therapy    Patient declined antiestrogen therapy       Review of Systems  Constitutional: Positive for malaise/fatigue and weight loss.  Musculoskeletal: Positive for back pain.  Neurological: Positive for weakness.  All other systems reviewed and are negative.   Past Medical History:  Diagnosis Date  . Arthritis   . Arthritis 12/04/2011  . Breast cancer, stage 1 ([place]) 12/04/2011  . Cancer ([place])   . Colon polyp   . Complication of anesthesia   . Depression   . Hemorrhoids   . History of kidney stones   . History of radiation therapy 12/04/2011  . Hypertension   . PONV (postoperative nausea and vomiting)     Past Surgical History:  Procedure Laterality Date  . ABDOMINAL HYSTERECTOMY    . BREAST SURGERY  11   left lumpectomy  . TOTAL HIP ARTHROPLASTY Left 09/25/2014   Procedure: TOTAL HIP ARTHROPLASTY;  Surgeon: [name], MD;  Location: [facility];  Service: Orthopedics;  Laterality: Left;    has Breast cancer of upper-outer quadrant of left female breast ([place]); Arthritis; History of radiation therapy; History of breast cancer; Protrusio acetabuli Left; Atrial fibrillation with RVR ([place]); HTN (hypertension); Dehydration; Hypokalemia; and Back pain on her problem list.    is allergic to codeine.    Medication List       Accurate as of 05/05/16 11:59 PM. Always use your most recent med list.           acetaminophen 650 MG CR tablet Commonly known as:  TYLENOL Take 1,300 mg by mouth daily as needed for pain.   apixaban 5 MG Tabs tablet Commonly known as:  ELIQUIS Take 1 tablet (5 mg total) by mouth 2 (two) times daily. Hospital providing 1 months coupon, after 1 month PCP to supply further refills.   GLUCOSAMINE 1500 COMPLEX Caps Take 1 capsule by mouth daily.   hydrochlorothiazide 25 MG tablet Commonly known as:  HYDRODIURIL Take 25 mg by mouth daily.   lisinopril 10 MG tablet Commonly known as:  PRINIVIL,ZESTRIL Take 10 mg by mouth daily.   metoprolol succinate 50 MG 24 hr tablet Commonly known as:  TOPROL-XL Take 1 tablet (50 mg total) by mouth daily.   morphine 15 MG 12 hr tablet Commonly known as:  MS CONTIN Take 1 tablet (15 mg total) by mouth every 12 (twelve) hours.   Omega 3 1200 MG Caps Take 1,200 mg by mouth every evening.   potassium chloride SA 20 MEQ tablet Commonly known as:  K-DUR,KLOR-CON Take 1 tablet (20 mEq total) by mouth daily.   sertraline 50 MG tablet Commonly known as:  ZOLOFT Take 25 mg by mouth at bedtime. Patient takes 1/2 tablet daily. Only taking 25 mg total a day.   traMADol 50 MG tablet Commonly known as:  ULTRAM Take 50 mg by mouth daily as  needed.   Vitamin D-3 1000 units Caps Take 1,000 Units by mouth daily.        PHYSICAL EXAMINATION  Oncology Vitals 05/05/2016 05/05/2016  Height - -  Weight - -  Weight (lbs) - -  BMI (kg/m2) - -  Temp 98.4 98.5  Pulse 72 68  Resp 18 18  Resp (Historical as of 01/08/12) - -  SpO2 - -  BSA (m2) - -   BP Readings from Last 2 Encounters:  05/05/16 (!) 154/72  04/25/16 124/62    Physical Exam  Constitutional: She is oriented to person, place, and time. Vital signs are normal. She appears malnourished and dehydrated. She appears unhealthy. She appears cachectic.  HENT:  Head: Normocephalic and atraumatic.  Mouth/Throat: Oropharynx is clear and moist.  Eyes: Conjunctivae  and EOM are normal. Pupils are equal, round, and reactive to light. Right eye exhibits no discharge. Left eye exhibits no discharge. No scleral icterus.  Neck: Normal range of motion. Neck supple. No JVD present. No tracheal deviation present. No thyromegaly present.  Cardiovascular: Normal rate, regular rhythm, normal heart sounds and intact distal pulses.   Pulmonary/Chest: Effort normal and breath sounds normal. No respiratory distress. She has no wheezes. She has no rales. She exhibits no tenderness.  Abdominal: Soft. Bowel sounds are normal. She exhibits no distension and no mass. There is no tenderness. There is no rebound and no guarding.  Musculoskeletal: Normal range of motion. She exhibits no edema or tenderness.  Lymphadenopathy:    She has no cervical adenopathy.  Neurological: She is alert and oriented to person, place, and time. Gait normal.  Skin: Skin is warm and dry. No rash noted. No erythema. No pallor.  Psychiatric: Affect normal.  Nursing note and vitals reviewed.   LABORATORY DATA:. Appointment on 05/05/2016  Component Date Value Ref Range Status  . WBC 05/05/2016 14.4* 3.9 - 10.3 10e3/uL Final  . NEUT# 05/05/2016 12.1* 1.5 - 6.5 10e3/uL Final  . HGB 05/05/2016 13.0  11.6 - 15.9 g/dL Final  . HCT 05/05/2016 37.9  34.8 - 46.6 % Final  . Platelets 05/05/2016 284  145 - 400 10e3/uL Final  . MCV 05/05/2016 89.8  79.5 - 101.0 fL Final  . MCH 05/05/2016 30.8  25.1 - 34.0 pg Final  . MCHC 05/05/2016 34.3  31.5 - 36.0 g/dL Final  . RBC 05/05/2016 4.22  3.70 - 5.45 10e6/uL Final  . RDW 05/05/2016 13.3  11.2 - 14.5 % Final  . lymph# 05/05/2016 0.9  0.9 - 3.3 10e3/uL Final  . MONO# 05/05/2016 1.2* 0.1 - 0.9 10e3/uL Final  . Eosinophils Absolute 05/05/2016 0.3  0.0 - 0.5 10e3/uL Final  . Basophils Absolute 05/05/2016 0.0  0.0 - 0.1 10e3/uL Final  . NEUT% 05/05/2016 83.7* 38.4 - 76.8 % Final  . LYMPH% 05/05/2016 6.0* 14.0 - 49.7 % Final  . MONO% 05/05/2016 8.2  0.0 - 14.0 %  Final  . EOS% 05/05/2016 2.0  0.0 - 7.0 % Final  . BASO% 05/05/2016 0.1  0.0 - 2.0 % Final  . Sodium 05/05/2016 134* 136 - 145 mEq/L Final  . Potassium 05/05/2016 2.8* 3.5 - 5.1 mEq/L Final  . Chloride 05/05/2016 94* 98 - 109 mEq/L Final  . CO2 05/05/2016 30* 22 - 29 mEq/L Final  . Glucose 05/05/2016 120  70 - 140 mg/dl Final  . BUN 05/05/2016 21.0  7.0 - 26.0 mg/dL Final  . Creatinine 05/05/2016 0.8  0.6 - 1.1 mg/dL Final  . Total  Bilirubin 05/05/2016 0.53  0.20 - 1.20 mg/dL Final  . Alkaline Phosphatase 05/05/2016 67  40 - 150 U/L Final  . AST 05/05/2016 10  5 - 34 U/L Final  . ALT 05/05/2016 6  0 - 55 U/L Final  . Total Protein 05/05/2016 7.2  6.4 - 8.3 g/dL Final  . Albumin 05/05/2016 3.0* 3.5 - 5.0 g/dL Final  . Calcium 05/05/2016 9.8  8.4 - 10.4 mg/dL Final  . Anion Gap 05/05/2016 11  3 - 11 mEq/L Final  . EGFR 05/05/2016 73* >90 ml/min/1.73 m2 Final    RADIOGRAPHIC STUDIES: No results found.  ASSESSMENT/PLAN:    Hypokalemia Patient states that she has been feeling nauseous, and dehydrated recently.  She has had very poor oral intake and will receive IV fluid rehydration today.  Her potassium was low at 2.8 today.  She will receive potassium 20 mEq in her IV fluids today; and Dr. [name] has also prescribed her potassium tablets to take at home as well.  Dehydration Patient states that she has been feeling nauseous, and dehydrated recently.  She has had very poor oral intake and will receive IV fluid rehydration today.  Her potassium was low at 2.8 today.  She will receive potassium 20 mEq in her IV fluids today; and Dr. [name] has also prescribed her potassium tablets to take at home as well.  Breast cancer of upper-outer quadrant of left female breast [facility]) Patient presented recently to the [facility] with an apparent recurrence of her breast cancer.  She is status post lumpectomy and radiation treatments which she received last in 2012.  She is scheduled for a restaging PET  scan on 05/07/2016.  She is scheduled for a follow-up visit with Dr. [name] on 05/08/2016.  Back pain Patient complains of low back pain that slightly radiates to the left.  She denies any UTI symptoms or recent fevers or chills.  She states that she was bending over on Tuesday of this past week; and felt a sharp pain in her back.  She feels that she may have just pulled a muscle.  On exam today.  Patient with no obvious distress and with full range of motion and able to bend from the waist down with no difficulty.  There was also no flank pain.  On exam, or rash noted.  Dr. [name] gave patient a prescription for MS Contin 15 mg to take on a twice-daily basis for treatment of pain.  Patient was advised to call/return or go directly to the emergency department for any worsening symptoms whatsoever.   Patient stated understanding of all instructions; and was in agreement with this plan of care. The patient knows to call the clinic with any problems, questions or concerns.   Total time spent with patient was 25 minutes;  with greater than 75 percent of that time spent in face to face counseling regarding patient's symptoms,  and coordination of care and follow up.  Disclaimer:This dictation was prepared with Dragon/digital dictation along with [employer]. Any transcriptional errors that result from this process are unintentional.  Drue Second, NP 05/06/2016  Reviewed prescriptions for MS Contin and KCL with pt and her daughter-in-law. Both voiced understanding.

## 2016-05-05 NOTE — Progress Notes (Signed)
Critical k level 2.8 received from lab. Discussed with Dr. [name] and pt to start on 29meq kcl po at home. Sent in to pharmacy. Pt being seen in [facility] today and to receive IVF with kcl. Will notify [facility] to let pt know to start kcl at home.

## 2016-05-05 NOTE — Patient Instructions (Signed)
Dehydration, Adult Dehydration is a condition in which there is not enough fluid or water in the body. This happens when you lose more fluids than you take in. Important organs, such as the kidneys, brain, and heart, cannot function without a proper amount of fluids. Any loss of fluids from the body can lead to dehydration. Dehydration can range from mild to severe. This condition should be treated right away to prevent it from becoming severe. What are the causes? This condition may be caused by:  Vomiting.  Diarrhea.  Excessive sweating, such as from heat exposure or exercise.  Not drinking enough fluid, especially:  When ill.  While doing activity that requires a lot of energy.  Excessive urination.  Fever.  Infection.  Certain medicines, such as medicines that cause the body to lose excess fluid (diuretics).  Inability to access safe drinking water.  Reduced physical ability to get adequate water and food. What increases the risk? This condition is more likely to develop in people:  Who have a poorly controlled long-term (chronic) illness, such as diabetes, heart disease, or kidney disease.  Who are age 65 or older.  Who are disabled.  Who live in a place with high altitude.  Who play endurance sports. What are the signs or symptoms? Symptoms of mild dehydration may include:   Thirst.  Dry lips.  Slightly dry mouth.  Dry, warm skin.  Dizziness. Symptoms of moderate dehydration may include:   Very dry mouth.  Muscle cramps.  Dark urine. Urine may be the color of tea.  Decreased urine production.  Decreased tear production.  Heartbeat that is irregular or faster than normal (palpitations).  Headache.  Light-headedness, especially when you stand up from a sitting position.  Fainting (syncope). Symptoms of severe dehydration may include:   Changes in skin, such as:  Cold and clammy skin.  Blotchy (mottled) or pale skin.  Skin that does  not quickly return to normal after being lightly pinched and released (poor skin turgor).  Changes in body fluids, such as:  Extreme thirst.  No tear production.  Inability to sweat when body temperature is high, such as in hot weather.  Very little urine production.  Changes in vital signs, such as:  Weak pulse.  Pulse that is more than 100 beats a minute when sitting still.  Rapid breathing.  Low blood pressure.  Other changes, such as:  Sunken eyes.  Cold hands and feet.  Confusion.  Lack of energy (lethargy).  Difficulty waking up from sleep.  Short-term weight loss.  Unconsciousness. How is this diagnosed? This condition is diagnosed based on your symptoms and a physical exam. Blood and urine tests may be done to help confirm the diagnosis. How is this treated? Treatment for this condition depends on the severity. Mild or moderate dehydration can often be treated at home. Treatment should be started right away. Do not wait until dehydration becomes severe. Severe dehydration is an emergency and it needs to be treated in a hospital. Treatment for mild dehydration may include:   Drinking more fluids.  Replacing salts and minerals in your blood (electrolytes) that you may have lost. Treatment for moderate dehydration may include:   Drinking an oral rehydration solution (ORS). This is a drink that helps you replace fluids and electrolytes (rehydrate). It can be found at pharmacies and retail stores. Treatment for severe dehydration may include:   Receiving fluids through an IV tube.  Receiving an electrolyte solution through a feeding tube that is   passed through your nose and into your stomach (nasogastric tube, or NG tube).  Correcting any abnormalities in electrolytes.  Treating the underlying cause of dehydration. Follow these instructions at home:  If directed by your health care provider, drink an ORS:  Make an ORS by following instructions on the  package.  Start by drinking small amounts, about  cup (120 mL) every 5-10 minutes.  Slowly increase how much you drink until you have taken the amount recommended by your health care provider.  Drink enough clear fluid to keep your urine clear or pale yellow. If you were told to drink an ORS, finish the ORS first, then start slowly drinking other clear fluids. Drink fluids such as:  Water. Do not drink only water. Doing that can lead to having too little salt (sodium) in the body (hyponatremia).  Ice chips.  Fruit juice that you have added water to (diluted fruit juice).  Low-calorie sports drinks.  Avoid:  Alcohol.  Drinks that contain a lot of sugar. These include high-calorie sports drinks, fruit juice that is not diluted, and soda.  Caffeine.  Foods that are greasy or contain a lot of fat or sugar.  Take over-the-counter and prescription medicines only as told by your health care provider.  Do not take sodium tablets. This can lead to having too much sodium in the body (hypernatremia).  Eat foods that contain a healthy balance of electrolytes, such as bananas, oranges, potatoes, tomatoes, and spinach.  Keep all follow-up visits as told by your health care provider. This is important. Contact a health care provider if:  You have abdominal pain that:  Gets worse.  Stays in one area (localizes).  You have a rash.  You have a stiff neck.  You are more irritable than usual.  You are sleepier or more difficult to wake up than usual.  You feel weak or dizzy.  You feel very thirsty.  You have urinated only a small amount of very dark urine over 6-8 hours. Get help right away if:  You have symptoms of severe dehydration.  You cannot drink fluids without vomiting.  Your symptoms get worse with treatment.  You have a fever.  You have a severe headache.  You have vomiting or diarrhea that:  Gets worse.  Does not go away.  You have blood or green matter  (bile) in your vomit.  You have blood in your stool. This may cause stool to look black and tarry.  You have not urinated in 6-8 hours.  You faint.  Your heart rate while sitting still is over 100 beats a minute.  You have trouble breathing. This information is not intended to replace advice given to you by your health care provider. Make sure you discuss any questions you have with your health care provider. Document Released: 05/26/2005 Document Revised: 12/21/2015 Document Reviewed: 07/20/2015 Elsevier Interactive Patient Education  2017 [employer]  

## 2016-05-06 ENCOUNTER — Encounter: Payer: Self-pay | Admitting: Nurse Practitioner

## 2016-05-06 DIAGNOSIS — E876 Hypokalemia: Secondary | ICD-10-CM | POA: Insufficient documentation

## 2016-05-06 DIAGNOSIS — M549 Dorsalgia, unspecified: Secondary | ICD-10-CM | POA: Insufficient documentation

## 2016-05-06 DIAGNOSIS — E860 Dehydration: Secondary | ICD-10-CM | POA: Insufficient documentation

## 2016-05-06 NOTE — Assessment & Plan Note (Signed)
Patient presented recently to the [facility] with an apparent recurrence of her breast cancer.  She is status post lumpectomy and radiation treatments which she received last in 2012.  She is scheduled for a restaging PET scan on 05/07/2016.  She is scheduled for a follow-up visit with Dr. [name] on 05/08/2016.

## 2016-05-06 NOTE — Assessment & Plan Note (Signed)
Patient states that she has been feeling nauseous, and dehydrated recently.  She has had very poor oral intake and will receive IV fluid rehydration today.  Her potassium was low at 2.8 today.  She will receive potassium 20 mEq in her IV fluids today; and Dr. [name] has also prescribed her potassium tablets to take at home as well.

## 2016-05-06 NOTE — Assessment & Plan Note (Signed)
Patient complains of low back pain that slightly radiates to the left.  She denies any UTI symptoms or recent fevers or chills.  She states that she was bending over on Tuesday of this past week; and felt a sharp pain in her back.  She feels that she may have just pulled a muscle.  On exam today.  Patient with no obvious distress and with full range of motion and able to bend from the waist down with no difficulty.  There was also no flank pain.  On exam, or rash noted.  Dr. [name] gave patient a prescription for MS Contin 15 mg to take on a twice-daily basis for treatment of pain.  Patient was advised to call/return or go directly to the emergency department for any worsening symptoms whatsoever.

## 2016-05-07 ENCOUNTER — Encounter (HOSPITAL_COMMUNITY)
Admission: RE | Admit: 2016-05-07 | Discharge: 2016-05-07 | Disposition: A | Discharge status: Home / Self Care | Payer: Medicare Other | Source: Ambulatory Visit | Attending: Hematology and Oncology | Admitting: Hematology and Oncology

## 2016-05-07 DIAGNOSIS — Z170 Estrogen receptor positive status [ER+]: Secondary | ICD-10-CM | POA: Diagnosis present

## 2016-05-07 DIAGNOSIS — C50412 Malignant neoplasm of upper-outer quadrant of left female breast: Secondary | ICD-10-CM | POA: Insufficient documentation

## 2016-05-07 LAB — GLUCOSE, CAPILLARY: GLUCOSE-CAPILLARY: 105 mg/dL — AB (ref 65–99)

## 2016-05-07 MED ORDER — FLUDEOXYGLUCOSE F - 18 (FDG) INJECTION
5.2500 | Freq: Once | INTRAVENOUS | Status: AC | PRN
  Administered 2016-05-07: 5.25 via INTRAVENOUS

## 2016-05-07 NOTE — Assessment & Plan Note (Signed)
Left breast invasive ductal carcinoma T1 C. N0 M0 stage IA grade 1 ER/PR positive HER-2 negative Ki-67 8% 2 SLN negative status post lumpectomy In December 2011 and radiation. Patient refused antiestrogen therapy and is currently on observation.  November 2017 Left shoulder MRI: Increased activity in the left humeral head bone marrow, left glenohumeral soft tissue mass measuring 3.8 cm. Is a suspicious for metastatic cancer versus primary bone tumors like chondrosarcoma.  PET-CT scan: 05/07/16: Multifocal bone mets. Prom Left humerus, L-1 compression deformity, right skull base, multifocal pelvic hypermetabolism, T-9 , T 12 mets  Plan: Biopsy the shoulder mass Refer for palliative XRT RTC after biopsy to discuss treatment plan.

## 2016-05-08 ENCOUNTER — Ambulatory Visit (HOSPITAL_BASED_OUTPATIENT_CLINIC_OR_DEPARTMENT_OTHER): Payer: Medicare Other | Admitting: Hematology and Oncology

## 2016-05-08 ENCOUNTER — Encounter: Payer: Self-pay | Admitting: Hematology and Oncology

## 2016-05-08 DIAGNOSIS — Z170 Estrogen receptor positive status [ER+]: Secondary | ICD-10-CM | POA: Diagnosis not present

## 2016-05-08 DIAGNOSIS — C7951 Secondary malignant neoplasm of bone: Secondary | ICD-10-CM | POA: Diagnosis not present

## 2016-05-08 DIAGNOSIS — C50412 Malignant neoplasm of upper-outer quadrant of left female breast: Secondary | ICD-10-CM | POA: Diagnosis not present

## 2016-05-08 MED ORDER — ANASTROZOLE 1 MG PO TABS: 1.0000 mg | ORAL_TABLET | Freq: Every day | ORAL | 3 refills | Status: AC

## 2016-05-08 NOTE — Progress Notes (Signed)
Patient Care Team: Maurice Small, MD as PCP - General (Family Medicine)  DIAGNOSIS:  Encounter Diagnosis  Name Primary?  . Malignant neoplasm of upper-outer quadrant of left breast in female, estrogen receptor positive ([place])     SUMMARY OF ONCOLOGIC HISTORY:   Breast cancer of upper-outer quadrant of left female breast ([place])   05/23/2010 Surgery    Left breast lumpectomy 1.5 cm grade 1 stage IA T1 C. N0 M0 invasive ductal carcinoma ER/PR positive HER-2 negative Ki-67 80%, 2 SLN negative      06/08/2010 - 08/05/2010 Radiation Therapy    Radiation therapy to the left breast lumpectomy       Anti-estrogen oral therapy    Patient declined antiestrogen therapy      05/07/2016 PET scan    Multifocal hypermetabolic bone lesions consistent with bone metastases, left proximal humerus with soft tissue component and pathologic fracture SUV 23.4, right skull base, pelvic lesions especially right acetabulum, L1, T12, T9 bone metastases       CHIEF COMPLIANT: Follow-up to review the PET CT scan  INTERVAL HISTORY: Sherri Tucker is a 81 year old with a prior history of left breast cancer treated with lumpectomy and radiation. She declined antiestrogen therapy and now presents with a large soft tissue mass in the left proximal humerus with a pathologic fracture. She underwent a PET/CT scan yesterday and is here today to discuss the reports. She continues to have pain and discomfort and limitation of range of motion on the left shoulder. She is also complaining of low back pain. She was started on morphine 15 mg twice a day. She tells me that he does not helping her pain at all.  REVIEW OF SYSTEMS:   Constitutional: Denies fevers, chills or abnormal weight loss Eyes: Denies blurriness of vision Ears, nose, mouth, throat, and face: Denies mucositis or sore throat Respiratory: Denies cough, dyspnea or wheezes Cardiovascular: Denies palpitation, chest discomfort Gastrointestinal:  Denies nausea,  heartburn or change in bowel habits Skin: Denies abnormal skin rashes Lymphatics: Denies new lymphadenopathy or easy bruising Neurological: Continues to have pain in the left shoulder and back Behavioral/Psych: Mood is stable, no new changes  Extremities: Left shoulder pain recently range of motion All other systems were reviewed with the patient and are negative.  I have reviewed the past medical history, past surgical history, social history and family history with the patient and they are unchanged from previous note.  ALLERGIES:  is allergic to codeine.  MEDICATIONS:  Current Outpatient Prescriptions  Medication Sig Dispense Refill  . acetaminophen (TYLENOL) 650 MG CR tablet Take 1,300 mg by mouth daily as needed for pain.    Marland Kitchen anastrozole (ARIMIDEX) 1 MG tablet Take 1 tablet (1 mg total) by mouth daily. 90 tablet 3  . apixaban (ELIQUIS) 5 MG TABS tablet Take 1 tablet (5 mg total) by mouth 2 (two) times daily. Hospital providing 1 months coupon, after 1 month PCP to supply further refills. 180 tablet 3  . Cholecalciferol (VITAMIN D-3) 1000 UNITS CAPS Take 1,000 Units by mouth daily.    . Glucosamine-Chondroit-Vit C-Mn (GLUCOSAMINE 1500 COMPLEX) CAPS Take 1 capsule by mouth daily.    . hydrochlorothiazide (HYDRODIURIL) 25 MG tablet Take 25 mg by mouth daily.      Marland Kitchen lisinopril (PRINIVIL,ZESTRIL) 10 MG tablet Take 10 mg by mouth daily.      . metoprolol succinate (TOPROL-XL) 50 MG 24 hr tablet Take 1 tablet (50 mg total) by mouth daily. 30 tablet 0  .  morphine (MS CONTIN) 15 MG 12 hr tablet Take 1 tablet (15 mg total) by mouth every 12 (twelve) hours. 30 tablet 0  . Omega 3 1200 MG CAPS Take 1,200 mg by mouth every evening.    . potassium chloride SA (K-DUR,KLOR-CON) 20 MEQ tablet Take 1 tablet (20 mEq total) by mouth daily. 1 tablet 0  . sertraline (ZOLOFT) 50 MG tablet Take 25 mg by mouth at bedtime. Patient takes 1/2 tablet daily. Only taking 25 mg total a day.    . traMADol (ULTRAM)  50 MG tablet Take 50 mg by mouth daily as needed.  3   No current facility-administered medications for this visit.     PHYSICAL EXAMINATION: ECOG PERFORMANCE STATUS: 1 - Symptomatic but completely ambulatory  Vitals:   05/08/16 1358  BP: (!) 147/69  Pulse: 75  Resp: 17  Temp: 98.1 F (36.7 C)   Filed Weights   05/08/16 1358  Weight: 107 lb 12.8 oz (48.9 kg)    GENERAL:alert, no distress and comfortable SKIN: skin color, texture, turgor are normal, no rashes or significant lesions EYES: normal, Conjunctiva are pink and non-injected, sclera clear OROPHARYNX:no exudate, no erythema and lips, buccal mucosa, and tongue normal  NECK: supple, thyroid normal size, non-tender, without nodularity LYMPH:  no palpable lymphadenopathy in the cervical, axillary or inguinal LUNGS: clear to auscultation and percussion with normal breathing effort HEART: regular rate & rhythm and no murmurs and no lower extremity edema ABDOMEN:abdomen soft, non-tender and normal bowel sounds MUSCULOSKELETAL:no cyanosis of digits and no clubbing  NEURO: alert & oriented x 3 with fluent speech, no focal motor/sensory deficits EXTREMITIES: Left shoulder pain and decreased range of motion   LABORATORY DATA:  I have reviewed the data as listed   Chemistry      Component Value Date/Time   NA 134 (L) 05/05/2016 1220   K 2.8 (LL) 05/05/2016 1220   CL 100 01/02/2016 1625   CO2 30 (H) 05/05/2016 1220   BUN 21.0 05/05/2016 1220   CREATININE 0.8 05/05/2016 1220      Component Value Date/Time   CALCIUM 9.8 05/05/2016 1220   ALKPHOS 67 05/05/2016 1220   AST 10 05/05/2016 1220   ALT 6 05/05/2016 1220   BILITOT 0.53 05/05/2016 1220       Lab Results  Component Value Date   WBC 14.4 (H) 05/05/2016   HGB 13.0 05/05/2016   HCT 37.9 05/05/2016   MCV 89.8 05/05/2016   PLT 284 05/05/2016   NEUTROABS 12.1 (H) 05/05/2016    ASSESSMENT & PLAN:  Breast cancer of upper-outer quadrant of left female breast  (HCC) Left breast invasive ductal carcinoma T1 C. N0 M0 stage IA grade 1 ER/PR positive HER-2 negative Ki-67 8% 2 SLN negative status post lumpectomy In December 2011 and radiation. Patient refused antiestrogen therapy and is currently on observation.  November 2017 Left shoulder MRI: Increased activity in the left humeral head bone marrow, left glenohumeral soft tissue mass measuring 3.8 cm. Is a suspicious for metastatic cancer versus primary bone tumors like chondrosarcoma.  PET-CT scan: 05/07/16: Multifocal bone mets. Prom Left humerus, L-1 compression deformity, right skull base, multifocal pelvic hypermetabolism, T-9 , T 12 mets  Plan: Biopsy the shoulder mass Refer for palliative XRT RTC after biopsy to discuss treatment plan. I will start the patient on anastrozole today. If it is confirmed to be ER positive breast cancer, we will need to add Ibrance to her treatment. Return to clinic in 2 weeks.  Orders  Placed This Encounter  Procedures  . CT BIOPSY    Discussed with Dr.Hoss    Standing Status:   Future    Standing Expiration Date:   06/12/2017    Order Specific Question:   Reason for Exam (SYMPTOM  OR DIAGNOSIS REQUIRED)    Answer:   Left subscapularis muscle    Order Specific Question:   Preferred imaging location?    Answer:   [facility]  . Ambulatory referral to Radiation Oncology    Referral Priority:   Urgent    Referral Type:   Consultation    Referral Reason:   Specialty Services Required    Requested Specialty:   Radiation Oncology    Number of Visits Requested:   1   The patient has a good understanding of the overall plan. she agrees with it. she will call with any problems that may develop before the next visit here.   Rulon Eisenmenger, MD 05/08/16

## 2016-05-09 ENCOUNTER — Encounter: Payer: Self-pay | Admitting: Radiation Oncology

## 2016-05-12 ENCOUNTER — Ambulatory Visit: Payer: Medicare Other | Admitting: Hematology and Oncology

## 2016-05-12 ENCOUNTER — Other Ambulatory Visit: Payer: Self-pay

## 2016-05-12 ENCOUNTER — Telehealth: Payer: Self-pay

## 2016-05-12 DIAGNOSIS — C50412 Malignant neoplasm of upper-outer quadrant of left female breast: Secondary | ICD-10-CM

## 2016-05-12 DIAGNOSIS — C50919 Malignant neoplasm of unspecified site of unspecified female breast: Secondary | ICD-10-CM

## 2016-05-12 DIAGNOSIS — Z170 Estrogen receptor positive status [ER+]: Principal | ICD-10-CM

## 2016-05-12 MED ORDER — LIDOCAINE 5 % EX PTCH: 1.0000 | MEDICATED_PATCH | CUTANEOUS | 0 refills | Status: DC

## 2016-05-12 MED ORDER — MORPHINE SULFATE 15 MG PO TABS: 15.0000 mg | ORAL_TABLET | Freq: Four times a day (QID) | ORAL | 0 refills | Status: DC | PRN

## 2016-05-12 NOTE — Assessment & Plan Note (Signed)
Left breast invasive ductal carcinoma T1 C. N0 M0 stage IA grade 1 ER/PR positive HER-2 negative Ki-67 8% 2 SLN negative status post lumpectomy In December 2011and radiation. Patient refusedantiestrogen therapy and is currently on observation.  November 2017 Left shoulder MRI: Increased activity in the left humeral head bone marrow, left glenohumeral soft tissue mass measuring 3.8 cm. Is a suspicious for metastatic cancer versus primary bone tumors like chondrosarcoma.  PET-CT scan: 05/07/16: Multifocal bone mets. Prom Left humerus, L-1 compression deformity, right skull base, multifocal pelvic hypermetabolism, T-9 , T 12 mets  Plan: Biopsy the shoulder mass to be done 05/15/16 Refer for palliative XRT: Start today RTC after biopsy to discuss treatment plan. Pain Management: On MSIR and MS Contin  Current Treatment: anastrozole started 04/28/16 . If it is confirmed to be ER positive breast cancer, we will need to add Ibrance to her treatment. Return to clinic in 2 weeks.

## 2016-05-12 NOTE — Telephone Encounter (Addendum)
Received vm from Shirlean Mylar, daughter in law regarding pt increased low back pain. Shirlean Mylar states that since she started taking MS Contin, pt started to become more confused and incontinent. Called and left vm with call back number.

## 2016-05-12 NOTE — Progress Notes (Signed)
Histology and Location of Primary Cancer: Breast cancer of upper-outer quadrant of left breast   Sites of Visceral and Bony Metastatic Disease: PET-CT scan: 05/07/16: Multifocal bone mets. Prom Left humerus, L-1 compression deformity, right skull base, multifocal pelvic hypermetabolism, T-9 , T 12 mets  Location(s) of Symptomatic Metastases: right hip and left proximal humerus  Past/Anticipated chemotherapy by medical oncology, if any: CT biopsy of left shoulder mass scheduled on 05/15/16.  Patient started on Anastrazole. Per Dr. [name], If the mass is confirmed to be ER positive breast cancer, we will need to add Ibrance to her treatment.   Pain on a scale of 0-10 is: 10/10 in her right hip.  Was given a script today by Dr. [name] for Fentanyl and dilaudid.  If Spine Met(s), symptoms, if any, include:  Bowel/Bladder retention or incontinence (please describe): reports having constipation for the past 4 days.  She is taking a stool softener.  Numbness or weakness in extremities (please describe): left arm weakness.    Current Decadron regimen, if applicable: none  Ambulatory status? Walker? Wheelchair?: Wheelchair today but uses a cane at home.  SAFETY ISSUES:  Prior radiation? 06/08/2010 - 08/05/2010 left breast 4500 cGy  Pacemaker/ICD? no  Possible current pregnancy? no  Is the patient on methotrexate? no  Current Complaints / other details:  Patient is here with her son.  BP (!) 170/77 (BP Location: Right Arm, Patient Position: Sitting)   Pulse 81   Temp 98.7 F (37.1 C) (Oral)   Ht '5\' 3"'$  (1.6 m)   SpO2 97%   BMI 18.94 kg/m    Wt Readings from Last 3 Encounters:  05/13/16 106 lb 14.4 oz (48.5 kg)  05/08/16 107 lb 12.8 oz (48.9 kg)  05/05/16 106 lb 14.4 oz (48.5 kg)

## 2016-05-12 NOTE — Progress Notes (Signed)
Pt daughter called and expressed concern regarding pt uncontrolled back pain. Pt has been with son and daughter in law since pt symptoms worsened over the weekend. Sherri Tucker, daughter in law reports pt increased pain and inability to ambulate and have bladder control. Sherri Tucker states that the pt had been taking MS Contin in between tylenol and tramadol, and has since developed confusion and unsteady gait. Pt is has not been eating and hydrating very well. Told daughter in law that we can bring her in for symptom management to get some IV hydration. Sherri Tucker reports that she works during the day and the son works as well. They have very minimal family support with caring for the patient during the day and are in need of resources. Also requesting for more pain medication for pain. Pt has shoulder biopsy and radiation tomorrow with Dr.Kinard. Discussed with Dr.Gudena and obtained orders to refer pt to home care and palliative care for management of symptoms and to provide family support and resources. Sherri Tucker or pt son will need to come in and pick up MSIR prescription. Also sent in prescription for lidocaine patch to help with back pain. Told Sherri Tucker to seek medical attention in the ED if pain or symptoms worsen overnight. She verbalized understanding and has no further questions at this time. Will contact home care and palliative care to process referral.  1615- Pt son came in to pick up pain medication MSIR. Explained to him that pt will be seen by radiation tomorrow and by Dr. [name]. Also notified the son regarding referrals to palliative and personal home care. Pt son appreciative and will be here with patient tomorrow.

## 2016-05-13 ENCOUNTER — Ambulatory Visit (HOSPITAL_BASED_OUTPATIENT_CLINIC_OR_DEPARTMENT_OTHER): Payer: Medicare Other | Admitting: Hematology and Oncology

## 2016-05-13 ENCOUNTER — Other Ambulatory Visit: Payer: Self-pay

## 2016-05-13 ENCOUNTER — Ambulatory Visit
Admission: RE | Admit: 2016-05-13 | Discharge: 2016-05-13 | Disposition: A | Discharge status: Home / Self Care | Payer: Medicare Other | Source: Ambulatory Visit | Attending: Radiation Oncology | Admitting: Radiation Oncology

## 2016-05-13 ENCOUNTER — Encounter: Payer: Self-pay | Admitting: Radiation Oncology

## 2016-05-13 ENCOUNTER — Encounter: Payer: Self-pay | Admitting: Hematology and Oncology

## 2016-05-13 VITALS — BP 170/77 | HR 81 | Temp 98.7°F | Ht 63.0 in

## 2016-05-13 DIAGNOSIS — C7951 Secondary malignant neoplasm of bone: Secondary | ICD-10-CM | POA: Diagnosis not present

## 2016-05-13 DIAGNOSIS — C50412 Malignant neoplasm of upper-outer quadrant of left female breast: Secondary | ICD-10-CM | POA: Diagnosis not present

## 2016-05-13 DIAGNOSIS — C7952 Secondary malignant neoplasm of bone marrow: Secondary | ICD-10-CM | POA: Insufficient documentation

## 2016-05-13 DIAGNOSIS — Z510 Encounter for antineoplastic radiation therapy: Secondary | ICD-10-CM | POA: Diagnosis not present

## 2016-05-13 DIAGNOSIS — M1611 Unilateral primary osteoarthritis, right hip: Secondary | ICD-10-CM | POA: Diagnosis not present

## 2016-05-13 DIAGNOSIS — J90 Pleural effusion, not elsewhere classified: Secondary | ICD-10-CM | POA: Diagnosis not present

## 2016-05-13 DIAGNOSIS — Z853 Personal history of malignant neoplasm of breast: Secondary | ICD-10-CM | POA: Insufficient documentation

## 2016-05-13 DIAGNOSIS — Z885 Allergy status to narcotic agent status: Secondary | ICD-10-CM | POA: Diagnosis not present

## 2016-05-13 DIAGNOSIS — Z170 Estrogen receptor positive status [ER+]: Secondary | ICD-10-CM | POA: Insufficient documentation

## 2016-05-13 DIAGNOSIS — Z79899 Other long term (current) drug therapy: Secondary | ICD-10-CM | POA: Diagnosis not present

## 2016-05-13 DIAGNOSIS — M8580 Other specified disorders of bone density and structure, unspecified site: Secondary | ICD-10-CM | POA: Diagnosis not present

## 2016-05-13 DIAGNOSIS — K5900 Constipation, unspecified: Secondary | ICD-10-CM | POA: Insufficient documentation

## 2016-05-13 DIAGNOSIS — R222 Localized swelling, mass and lump, trunk: Secondary | ICD-10-CM | POA: Diagnosis not present

## 2016-05-13 DIAGNOSIS — N200 Calculus of kidney: Secondary | ICD-10-CM | POA: Insufficient documentation

## 2016-05-13 DIAGNOSIS — Z7189 Other specified counseling: Secondary | ICD-10-CM

## 2016-05-13 MED ORDER — HYDROMORPHONE HCL 4 MG/ML IJ SOLN
2.0000 mg | Freq: Once | INTRAMUSCULAR | Status: DC
Start: 2016-05-13 — End: 2016-05-13

## 2016-05-13 MED ORDER — HYDROMORPHONE HCL 2 MG PO TABS: 2.0000 mg | ORAL_TABLET | ORAL | 0 refills | Status: AC | PRN

## 2016-05-13 MED ORDER — HYDROMORPHONE HCL 4 MG/ML IJ SOLN
2.0000 mg | Freq: Once | INTRAMUSCULAR | Status: AC
Start: 2016-05-13 — End: 2016-05-13
  Administered 2016-05-13: 2 mg via INTRAMUSCULAR

## 2016-05-13 MED ORDER — FENTANYL 25 MCG/HR TD PT72: 25.0000 ug | MEDICATED_PATCH | TRANSDERMAL | 0 refills | Status: DC

## 2016-05-13 MED ORDER — HYDROMORPHONE HCL 4 MG/ML IJ SOLN
INTRAMUSCULAR | Status: AC
  Filled 2016-05-13: qty 1

## 2016-05-13 NOTE — Progress Notes (Signed)
Patient Care Team: Maurice Small, MD as PCP - General (Family Medicine)  DIAGNOSIS:  Encounter Diagnosis  Name Primary?  . Malignant neoplasm of upper-outer quadrant of left breast in female, estrogen receptor positive ([place])     SUMMARY OF ONCOLOGIC HISTORY:   Breast cancer of upper-outer quadrant of left female breast ([place])   05/23/2010 Surgery    Left breast lumpectomy 1.5 cm grade 1 stage IA T1 C. N0 M0 invasive ductal carcinoma ER/PR positive HER-2 negative Ki-67 80%, 2 SLN negative      06/08/2010 - 08/05/2010 Radiation Therapy    Radiation therapy to the left breast lumpectomy       Anti-estrogen oral therapy    Patient declined antiestrogen therapy      05/07/2016 PET scan    Multifocal hypermetabolic bone lesions consistent with bone metastases, left proximal humerus with soft tissue component and pathologic fracture SUV 23.4, right skull base, pelvic lesions especially right acetabulum, L1, T12, T9 bone metastases       CHIEF COMPLIANT: Intractable pain in the shoulder and back  INTERVAL HISTORY: Sherri Tucker is a 81 year old widespread bony metastatic disease is here because of worsening pain in the shoulder. At the shoulder she has a broken bone with a soft tissue mass is causing her severe pain. We have prescribed her MS Contin previously and we have yesterday prescribed her MSIR as well. We requested radiation oncology to see her ASAP. She is seeing Dr. Sondra Come today. She will be getting a bone biopsy on December 7. We should have the pathology report in a few days. She was started on anastrozole therapy last week. I will also consulted palliative care. Today the patient is complaining of intractable right type pain. She thinks it's like a muscle cramp. She tells me that the MS Contin and MSIR are not helping.  REVIEW OF SYSTEMS:   Constitutional: Denies fevers, chills or abnormal weight loss Eyes: Denies blurriness of vision Ears, nose, mouth, throat, and face:  Denies mucositis or sore throat Respiratory: Denies cough, dyspnea or wheezes Cardiovascular: Denies palpitation, chest discomfort Gastrointestinal:  Denies nausea, heartburn or change in bowel habits Skin: Denies abnormal skin rashes Lymphatics: Denies new lymphadenopathy or easy bruising Neurological:Denies numbness, tingling or new weaknesses Behavioral/Psych: Mood is stable, no new changes  Extremities: Severe pain in the shoulder and lower back as well as a right groin and right thigh  All other systems were reviewed with the patient and are negative.  I have reviewed the past medical history, past surgical history, social history and family history with the patient and they are unchanged from previous note.  ALLERGIES:  is allergic to codeine.  MEDICATIONS:  Current Outpatient Prescriptions  Medication Sig Dispense Refill  . acetaminophen (TYLENOL) 650 MG CR tablet Take 1,300 mg by mouth daily as needed for pain.    Marland Kitchen anastrozole (ARIMIDEX) 1 MG tablet Take 1 tablet (1 mg total) by mouth daily. 90 tablet 3  . apixaban (ELIQUIS) 5 MG TABS tablet Take 1 tablet (5 mg total) by mouth 2 (two) times daily. Hospital providing 1 months coupon, after 1 month PCP to supply further refills. 180 tablet 3  . Cholecalciferol (VITAMIN D-3) 1000 UNITS CAPS Take 1,000 Units by mouth daily.    . fentaNYL (DURAGESIC - DOSED MCG/HR) 25 MCG/HR patch Place 1 patch (25 mcg total) onto the skin every 3 (three) days. (Patient not taking: Reported on 05/13/2016) 10 patch 0  . Glucosamine-Chondroit-Vit C-Mn (GLUCOSAMINE 1500 COMPLEX) CAPS  Take 1 capsule by mouth daily.    . hydrochlorothiazide (HYDRODIURIL) 25 MG tablet Take 25 mg by mouth daily.      Marland Kitchen HYDROmorphone (DILAUDID) 2 MG tablet Take 1 tablet (2 mg total) by mouth every 4 (four) hours as needed for severe pain. (Patient not taking: Reported on 05/13/2016) 120 tablet 0  . lidocaine (LIDODERM) 5 % Place 1 patch onto the skin daily. Remove & Discard  patch within 12 hours or as directed by MD (Patient not taking: Reported on 05/13/2016) 30 patch 0  . lisinopril (PRINIVIL,ZESTRIL) 10 MG tablet Take 10 mg by mouth daily.      . metoprolol succinate (TOPROL-XL) 50 MG 24 hr tablet Take 1 tablet (50 mg total) by mouth daily. 30 tablet 0  . Omega 3 1200 MG CAPS Take 1,200 mg by mouth every evening.    . potassium chloride SA (K-DUR,KLOR-CON) 20 MEQ tablet Take 1 tablet (20 mEq total) by mouth daily. 1 tablet 0  . sertraline (ZOLOFT) 50 MG tablet Take 25 mg by mouth at bedtime. Patient takes 1/2 tablet daily. Only taking 25 mg total a day.     No current facility-administered medications for this visit.     PHYSICAL EXAMINATION: ECOG PERFORMANCE STATUS: 3 - Symptomatic, >50% confined to bed  Vitals:   05/13/16 1219  BP: (!) 173/73  Pulse: 74  Resp: 17  Temp: 98 F (36.7 C)   Filed Weights   05/13/16 1219  Weight: 106 lb 14.4 oz (48.5 kg)    GENERAL:alert, no distress and comfortable SKIN: skin color, texture, turgor are normal, no rashes or significant lesions EYES: normal, Conjunctiva are pink and non-injected, sclera clear OROPHARYNX:no exudate, no erythema and lips, buccal mucosa, and tongue normal  NECK: supple, thyroid normal size, non-tender, without nodularity LYMPH:  no palpable lymphadenopathy in the cervical, axillary or inguinal LUNGS: clear to auscultation and percussion with normal breathing effort HEART: regular rate & rhythm and no murmurs and no lower extremity edema ABDOMEN:abdomen soft, non-tender and normal bowel sounds MUSCULOSKELETAL:Intractable pain in the right thigh and groin as well as left shoulder  NEURO: alert & oriented x 3 with fluent speech, no focal motor/sensory deficits EXTREMITIES: No lower extremity edema  LABORATORY DATA:  I have reviewed the data as listed   Chemistry      Component Value Date/Time   NA 134 (L) 05/05/2016 1220   K 2.8 (LL) 05/05/2016 1220   CL 100 01/02/2016 1625   CO2  30 (H) 05/05/2016 1220   BUN 21.0 05/05/2016 1220   CREATININE 0.8 05/05/2016 1220      Component Value Date/Time   CALCIUM 9.8 05/05/2016 1220   ALKPHOS 67 05/05/2016 1220   AST 10 05/05/2016 1220   ALT 6 05/05/2016 1220   BILITOT 0.53 05/05/2016 1220      Lab Results  Component Value Date   WBC 14.4 (H) 05/05/2016   HGB 13.0 05/05/2016   HCT 37.9 05/05/2016   MCV 89.8 05/05/2016   PLT 284 05/05/2016   NEUTROABS 12.1 (H) 05/05/2016    ASSESSMENT & PLAN:  Breast cancer of upper-outer quadrant of left female breast (HCC) Left breast invasive ductal carcinoma T1 C. N0 M0 stage IA grade 1 ER/PR positive HER-2 negative Ki-67 8% 2 SLN negative status post lumpectomy In December 2011and radiation. Patient refusedantiestrogen therapy and is currently on observation.  November 2017 Left shoulder MRI: Increased activity in the left humeral head bone marrow, left glenohumeral soft tissue mass measuring  3.8 cm. Is a suspicious for metastatic cancer versus primary bone tumors like chondrosarcoma.  PET-CT scan: 05/07/16: Multifocal bone mets. Prom Left humerus, L-1 compression deformity, right skull base, multifocal pelvic hypermetabolism, T-9 , T 12 mets  Plan: Biopsy the shoulder mass to be done 05/15/16 Refer for palliative XRT: Start today RTC after biopsy to discuss treatment plan.  Pain Management: I discontinued MSIR and MS Contin. I sent her with a prescription for fentanyl 25 g patch. I also gave her a dose of Dilaudid intravenously today in the clinic. We sent her with a prescription of Dilaudid 2 mg every 4 hours as needed for pain. We are really hoping that the radiation therapy will ease her pain. I offered the patient hospitalization to get the pain under control but she refused.  Palliative care consult  Current Treatment: anastrozole started 04/28/16 . If it is confirmed to be ER positive breast cancer, we will need to add Ibrance to her treatment. Goals of care: I  discussed goals of care with the patient. Patient understands that she has an incurable condition and that she has a poor prognosis based upon general frailty of her health and her poor performance status. I also discussed same with the patient's son and I encouraged the patient son to talk to her about end-of-life care. I also discussed briefly about hospice and palliative care options. They are not mentally prepared to do that at this time.  Return to clinic after biopsy to discuss the results.  No orders of the defined types were placed in this encounter.  The patient has a good understanding of the overall plan. she agrees with it. she will call with any problems that may develop before the next visit here.   Rulon Eisenmenger, MD 05/13/16

## 2016-05-13 NOTE — Progress Notes (Signed)
  Radiation Oncology         [phone] ________________________________  Name: Sherri Tucker MRN: [id number]  Date: 05/13/2016  DOB: 03-04-1935  SIMULATION AND TREATMENT PLANNING NOTE    ICD-9-CM ICD-10-CM   1. Secondary malignant neoplasm of bone and bone marrow (HCC) 198.5 C79.51     C79.52     DIAGNOSIS:  Metastatic breast cancer  NARRATIVE:  The patient was brought to the [facility].  Identity was confirmed.  All relevant records and images related to the planned course of therapy were reviewed.  The patient freely provided informed written consent to proceed with treatment after reviewing the details related to the planned course of therapy. The consent form was witnessed and verified by the simulation staff.  Then, the patient was set-up in a stable reproducible  supine position for radiation therapy.  CT images were obtained.  Surface markings were placed.  The CT images were loaded into the planning software.  Then the target and avoidance structures were contoured.  Treatment planning then occurred.  The radiation prescription was entered and confirmed.  Then, I designed and supervised the construction of a total of 6 medically necessary complex treatment devices.  I have requested : Isodose Plan.  I have ordered:dose calc.  PLAN:  The patient will receive 20 Gy in 5 fractions. The first treatment area will be the left shoulder region and the second treatment area will be the right pelvis region.  -----------------------------------  Blair Promise, PhD, MD

## 2016-05-13 NOTE — Progress Notes (Signed)
Pt having 10/10 back pain while seeing Dr. [name]. Obtained order for 2mg  IM dilaudid before leaving appt today. Given dilaudid R deltoid. Called in prescription for lidocaine patch at [place].

## 2016-05-13 NOTE — Progress Notes (Signed)
Contacted home care to see if they offer services to help pt family with caregiving support. Unfotunately, they only offer skilled nursing sevices. Referred to [facility] that may be able to help. Called [facility] and was able to obtain pertinent information for the patient family regarding setting up personal home care for pt. Called son and gave him all the information to set up services. Referral for palliative care was set up today as well.

## 2016-05-13 NOTE — Progress Notes (Signed)
Radiation Oncology         [phone]) [phone] ________________________________  Name: Sherri Tucker MRN: [id number]  Date: 05/13/2016  DOB: 25-Jan-1935  Re-Consultation Visit Note  CC: [name], MD  [name], MD    ICD-9-CM ICD-10-CM   1. Bone metastases (HCC) 198.5 C79.51   2. Secondary malignant neoplasm of bone and bone marrow (HCC) 198.5 C79.51     C79.52     Diagnosis:   Right Hip and Left Proximal Humerus Symptomatic Metastases  Interval Since Last Radiation:  5 years, Treatments directed at the left breast, 45 Gy in 18 fxs directed at the left breast, 5 Gy  boost directed at the lumpectomy cavity (hypo-fractionated accelerated radiation therapy)    Breast cancer of upper-outer quadrant of left female breast ([place])   05/23/2010 Surgery    Left breast lumpectomy 1.5 cm grade 1 stage IA T1 C. N0 M0 invasive ductal carcinoma ER/PR positive HER-2 negative Ki-67 80%, 2 SLN negative      06/08/2010 - 08/05/2010 Radiation Therapy    Radiation therapy to the left breast lumpectomy       Anti-estrogen oral therapy    Patient declined antiestrogen therapy      05/07/2016 PET scan    Multifocal hypermetabolic bone lesions consistent with bone metastases, left proximal humerus with soft tissue component and pathologic fracture SUV 23.4, right skull base, pelvic lesions especially right acetabulum, L1, T12, T9 bone metastases       Narrative:  The patient is a 81 year old female with a history of Breast cancer of the upper-outer quadrant of the left breast.  A PET-CT scan on 05/07/16 revealed multifocal bone metastases, as well as Prom Left humerus, L-1 compression deformity, right skull base, multifocal pelvic hypermetabolism, and T-9, T-12 mets.  The patient is scheduled for a CT biopsy of the left shoulder mass on 05/15/16. She has been started on Anastrazole. Per Dr. [name], should the mass be confirmed to be ER positive breast cancer, the patient will also begin  Ibrance.  Today the patient reports pain 10/10 in severity in her right hip. She was given a prescription for Fentanyl and dilaudid today from Dr. [name]. She reports having constipation for the past 4 days, and is taking a stool softener. She describes left arm weakness. The patient reports she uses a cane at home to ambulate, though she is in a wheelchair today in the clinic.  The patient reports to Radiation Oncology today to discuss the Right Hip and Left Proximal Humerus symptomatic metastases, and the role radiation therapy may play in the treatment and palliation of her disease. She is accompanied today by her son.                      ALLERGIES:  is allergic to codeine.  Meds: Current Outpatient Prescriptions  Medication Sig Dispense Refill  . acetaminophen (TYLENOL) 650 MG CR tablet Take 1,300 mg by mouth daily as needed for pain.    Marland Kitchen anastrozole (ARIMIDEX) 1 MG tablet Take 1 tablet (1 mg total) by mouth daily. 90 tablet 3  . apixaban (ELIQUIS) 5 MG TABS tablet Take 1 tablet (5 mg total) by mouth 2 (two) times daily. Hospital providing 1 months coupon, after 1 month PCP to supply further refills. 180 tablet 3  . Cholecalciferol (VITAMIN D-3) 1000 UNITS CAPS Take 1,000 Units by mouth daily.    . Glucosamine-Chondroit-Vit C-Mn (GLUCOSAMINE 1500 COMPLEX) CAPS Take 1 capsule by mouth  daily.    . hydrochlorothiazide (HYDRODIURIL) 25 MG tablet Take 25 mg by mouth daily.      Marland Kitchen lisinopril (PRINIVIL,ZESTRIL) 10 MG tablet Take 10 mg by mouth daily.      . metoprolol succinate (TOPROL-XL) 50 MG 24 hr tablet Take 1 tablet (50 mg total) by mouth daily. 30 tablet 0  . Omega 3 1200 MG CAPS Take 1,200 mg by mouth every evening.    . potassium chloride SA (K-DUR,KLOR-CON) 20 MEQ tablet Take 1 tablet (20 mEq total) by mouth daily. 1 tablet 0  . sertraline (ZOLOFT) 50 MG tablet Take 25 mg by mouth at bedtime. Patient takes 1/2 tablet daily. Only taking 25 mg total a day.    . fentaNYL (DURAGESIC -  DOSED MCG/HR) 25 MCG/HR patch Place 1 patch (25 mcg total) onto the skin every 3 (three) days. (Patient not taking: Reported on 05/13/2016) 10 patch 0  . HYDROmorphone (DILAUDID) 2 MG tablet Take 1 tablet (2 mg total) by mouth every 4 (four) hours as needed for severe pain. (Patient not taking: Reported on 05/13/2016) 120 tablet 0  . lidocaine (LIDODERM) 5 % Place 1 patch onto the skin daily. Remove & Discard patch within 12 hours or as directed by MD (Patient not taking: Reported on 05/13/2016) 30 patch 0   No current facility-administered medications for this encounter.     Physical Findings: The patient is in no acute distress. Patient is alert and oriented.  height is '5\' 3"'$  (1.6 m). Her oral temperature is 98.7 F (37.1 C). Her blood pressure is 170/77 (abnormal) and her pulse is 81. Her oxygen saturation is 97%. .  No significant changes. General: Alert and oriented, in no acute distress HEENT: Head is normocephalic.  Neck: Neck is supple, no palpable cervical or supraclavicular lymphadenopathy. Heart: Regular in rate and rhythm with no murmurs, rubs, or gallops. Chest: Clear to auscultation bilaterally, with no rhonchi, wheezes, or rales. Abdomen: Soft, nontender, nondistended, with no rigidity or guarding. Musculoskeletal: symmetric strength and muscle tone throughout. Decreased abduction of the left arm in light of her pain in the left shoulder. Psychiatric: Judgment and insight are intact. Affect is appropriate.  Lab Findings: Lab Results  Component Value Date   WBC 14.4 (H) 05/05/2016   HGB 13.0 05/05/2016   HCT 37.9 05/05/2016   MCV 89.8 05/05/2016   PLT 284 05/05/2016    Radiographic Findings: Nm Pet Image Restag (ps) Skull Base To Thigh  Result Date: 05/07/2016 CLINICAL DATA:  Initial Treatment strategy for left-sided breast cancer. Left humeral mass. Stage I breast cancer in 2013. EXAM: NUCLEAR MEDICINE PET SKULL BASE TO THIGH TECHNIQUE: 5.3 mCi F-18 FDG was injected  intravenously. Full-ring PET imaging was performed from the skull base to thigh after the radiotracer. CT data was obtained and used for attenuation correction and anatomic localization. FASTING BLOOD GLUCOSE:  Value: 105 mg/dl COMPARISON:  Left shoulderMRI OF 04/10/16. Abdominal pelvic CT/stone study of 06/13/2014. FINDINGS: NECK Relative hypermetabolism involving the periphery of the left cerebellar hemisphere may relate to atrophy, given suggestion of hypoattenuation in this area on image 13/series 3. No cervical nodal hypermetabolism. No cervical adenopathy. CHEST No thoracic nodal or pulmonary parenchymal hypermetabolism. Mild cardiomegaly. Small bilateral pleural effusions. No axillary adenopathy. Bibasilar atelectasis. ABDOMEN/PELVIS 11 mm left adrenal nodule measures a S.U.V. max of 4.3 on image 94/series 4. This is low in density, favoring an adenoma. No other soft tissue hypermetabolism identified, including within the abdominal pelvic nodal stations or parenchymal organs.  Bilateral renal cysts and collecting system calculi. Large volume lower pole right renal collecting system stones. Aortic and branch vessel atherosclerosis. Degraded evaluation of the pelvis, secondary to beam hardening artifact from left hip arthroplasty. SKELETON Marked hypermetabolism corresponding to the proximal left humerus lesion with soft tissue component and pathologic fracture. This measures a S.U.V. max of 23.4. Suspected osseous lesion at the right skullbase, a focus of hypermetabolism measuring a S.U.V. max of 9.4 on image 16/ series 4. Multifocal pelvic hypermetabolism, including a lesion about the posterior and superior aspect of the right acetabulum. This measures a S.U.V. max of 10.6. Hypermetabolism is identified about the periphery of the L1 vertebral body, which has undergone interval compression deformity since the CT of 06/13/2014. There is also chronic compression deformity of T12. Superior T9 hypermetabolic  metastasis. Left hip arthroplasty.  Right hip osteoarthritis.  Osteopenia. IMPRESSION: 1. Multifocal hypermetabolic osseous lesions, consistent with metastasis. The dominant lesion in the left proximal humerus is significantly hypermetabolic. In addition, there is L1 hypermetabolism with presumed secondary interval compression deformity. A T12 compression deformity is chronic. 2. Hypermetabolic left adrenal nodule is favored to represent an adenoma, given low-density. Otherwise, no evidence of soft Tissue metastasis. 3. Small bilateral pleural effusions. 4. Bilateral nephrolithiasis. 5. Suspect focal encephalomalacia involving the left cerebellar hemisphere with resultant hypometabolism. Electronically Signed   By: Abigail Miyamoto M.D.   On: 05/07/2016 15:43    Impression:  We discussed the risks, benefits, and side effects of radiation therapy. We discussed the logistics of radiation therapy for the patient's and family's benefit. This patient is a good candidate for palliative radiation therapy directed at the left shoulder and upper humerus area and right pelvis.  Plan:  The patient is enthusiastic about moving forward with radiation treatment to both the right pelvis and left proximal humerus. Written consent was obtained, and she is scheduled for CT Simulation this afternoon, with the expectation that radiation therapy will begin next later this week. I anticipate 5 treatments directed at both regions. -----------------------------------  Blair Promise, PhD, MD  This document serves as a record of services personally performed by Gery Pray, MD. It was created on his behalf by Maryla Morrow, a trained medical scribe. The creation of this record is based on the scribe's personal observations and the provider's statements to them. This document has been checked and approved by the attending provider.

## 2016-05-14 ENCOUNTER — Other Ambulatory Visit: Payer: Self-pay | Admitting: General Surgery

## 2016-05-14 DIAGNOSIS — Z510 Encounter for antineoplastic radiation therapy: Secondary | ICD-10-CM | POA: Diagnosis not present

## 2016-05-15 ENCOUNTER — Ambulatory Visit (HOSPITAL_COMMUNITY)
Admission: RE | Admit: 2016-05-15 | Discharge: 2016-05-15 | Disposition: A | Discharge status: Home / Self Care | Payer: Medicare Other | Source: Ambulatory Visit | Attending: Hematology and Oncology | Admitting: Hematology and Oncology

## 2016-05-15 ENCOUNTER — Emergency Department (HOSPITAL_COMMUNITY)
Admission: EM | Admit: 2016-05-15 | Discharge: 2016-05-15 | Disposition: A | Discharge status: Home / Self Care | Payer: Medicare Other | Attending: Emergency Medicine | Admitting: Emergency Medicine

## 2016-05-15 ENCOUNTER — Encounter (HOSPITAL_COMMUNITY): Payer: Self-pay | Admitting: Nurse Practitioner

## 2016-05-15 ENCOUNTER — Other Ambulatory Visit: Payer: Self-pay

## 2016-05-15 ENCOUNTER — Emergency Department (HOSPITAL_COMMUNITY): Payer: Medicare Other

## 2016-05-15 ENCOUNTER — Ambulatory Visit
Admission: RE | Admit: 2016-05-15 | Discharge: 2016-05-15 | Disposition: A | Discharge status: Home / Self Care | Payer: Medicare Other | Source: Ambulatory Visit | Attending: Radiation Oncology | Admitting: Radiation Oncology

## 2016-05-15 ENCOUNTER — Encounter (HOSPITAL_COMMUNITY): Payer: Self-pay

## 2016-05-15 DIAGNOSIS — I4891 Unspecified atrial fibrillation: Secondary | ICD-10-CM

## 2016-05-15 DIAGNOSIS — Z86010 Personal history of colonic polyps: Secondary | ICD-10-CM

## 2016-05-15 DIAGNOSIS — Z9889 Other specified postprocedural states: Secondary | ICD-10-CM

## 2016-05-15 DIAGNOSIS — Z8249 Family history of ischemic heart disease and other diseases of the circulatory system: Secondary | ICD-10-CM

## 2016-05-15 DIAGNOSIS — G893 Neoplasm related pain (acute) (chronic): Secondary | ICD-10-CM | POA: Diagnosis not present

## 2016-05-15 DIAGNOSIS — T402X5A Adverse effect of other opioids, initial encounter: Secondary | ICD-10-CM

## 2016-05-15 DIAGNOSIS — K921 Melena: Secondary | ICD-10-CM | POA: Insufficient documentation

## 2016-05-15 DIAGNOSIS — J90 Pleural effusion, not elsewhere classified: Secondary | ICD-10-CM

## 2016-05-15 DIAGNOSIS — M8580 Other specified disorders of bone density and structure, unspecified site: Secondary | ICD-10-CM | POA: Insufficient documentation

## 2016-05-15 DIAGNOSIS — F329 Major depressive disorder, single episode, unspecified: Secondary | ICD-10-CM

## 2016-05-15 DIAGNOSIS — C50412 Malignant neoplasm of upper-outer quadrant of left female breast: Secondary | ICD-10-CM

## 2016-05-15 DIAGNOSIS — M1611 Unilateral primary osteoarthritis, right hip: Secondary | ICD-10-CM | POA: Insufficient documentation

## 2016-05-15 DIAGNOSIS — Z170 Estrogen receptor positive status [ER+]: Principal | ICD-10-CM

## 2016-05-15 DIAGNOSIS — I119 Hypertensive heart disease without heart failure: Secondary | ICD-10-CM

## 2016-05-15 DIAGNOSIS — K5641 Fecal impaction: Secondary | ICD-10-CM

## 2016-05-15 DIAGNOSIS — Z853 Personal history of malignant neoplasm of breast: Secondary | ICD-10-CM | POA: Insufficient documentation

## 2016-05-15 DIAGNOSIS — Z96642 Presence of left artificial hip joint: Secondary | ICD-10-CM

## 2016-05-15 DIAGNOSIS — Z888 Allergy status to other drugs, medicaments and biological substances status: Secondary | ICD-10-CM | POA: Insufficient documentation

## 2016-05-15 DIAGNOSIS — C7952 Secondary malignant neoplasm of bone marrow: Principal | ICD-10-CM

## 2016-05-15 DIAGNOSIS — K5903 Drug induced constipation: Secondary | ICD-10-CM | POA: Insufficient documentation

## 2016-05-15 DIAGNOSIS — M25519 Pain in unspecified shoulder: Secondary | ICD-10-CM

## 2016-05-15 DIAGNOSIS — R197 Diarrhea, unspecified: Secondary | ICD-10-CM | POA: Insufficient documentation

## 2016-05-15 DIAGNOSIS — N200 Calculus of kidney: Secondary | ICD-10-CM

## 2016-05-15 DIAGNOSIS — Z87442 Personal history of urinary calculi: Secondary | ICD-10-CM | POA: Insufficient documentation

## 2016-05-15 DIAGNOSIS — Z510 Encounter for antineoplastic radiation therapy: Secondary | ICD-10-CM | POA: Diagnosis not present

## 2016-05-15 DIAGNOSIS — Z7901 Long term (current) use of anticoagulants: Secondary | ICD-10-CM | POA: Insufficient documentation

## 2016-05-15 DIAGNOSIS — I10 Essential (primary) hypertension: Secondary | ICD-10-CM | POA: Insufficient documentation

## 2016-05-15 DIAGNOSIS — C7951 Secondary malignant neoplasm of bone: Secondary | ICD-10-CM | POA: Insufficient documentation

## 2016-05-15 DIAGNOSIS — Z79899 Other long term (current) drug therapy: Secondary | ICD-10-CM

## 2016-05-15 DIAGNOSIS — K5900 Constipation, unspecified: Secondary | ICD-10-CM | POA: Insufficient documentation

## 2016-05-15 LAB — COMPREHENSIVE METABOLIC PANEL
ALT: 16 U/L (ref 14–54)
AST: 19 U/L (ref 15–41)
Albumin: 3.2 g/dL — ABNORMAL LOW (ref 3.5–5.0)
Alkaline Phosphatase: 56 U/L (ref 38–126)
Anion gap: 10 (ref 5–15)
BUN: 21 mg/dL — ABNORMAL HIGH (ref 6–20)
CHLORIDE: 104 mmol/L (ref 101–111)
CO2: 24 mmol/L (ref 22–32)
CREATININE: 0.7 mg/dL (ref 0.44–1.00)
Calcium: 8.7 mg/dL — ABNORMAL LOW (ref 8.9–10.3)
GFR calc non Af Amer: >60 mL/min (ref >60)
Glucose, Bld: 116 mg/dL — ABNORMAL HIGH (ref 65–99)
Potassium: 3.6 mmol/L (ref 3.5–5.1)
SODIUM: 138 mmol/L (ref 135–145)
Total Bilirubin: 0.9 mg/dL (ref 0.3–1.2)
Total Protein: 6.4 g/dL — ABNORMAL LOW (ref 6.5–8.1)

## 2016-05-15 LAB — CBC WITH DIFFERENTIAL/PLATELET
BASOS PCT: 0 %
Basophils Absolute: 0 10*3/uL (ref 0.0–0.1)
EOS PCT: 0 %
Eosinophils Absolute: 0 10*3/uL (ref 0.0–0.7)
HEMATOCRIT: 39 % (ref 36.0–46.0)
HEMOGLOBIN: 12.9 g/dL (ref 12.0–15.0)
Lymphocytes Relative: 4 %
Lymphs Abs: 1.1 10*3/uL (ref 0.7–4.0)
MCH: 30.6 pg (ref 26.0–34.0)
MCHC: 33.1 g/dL (ref 30.0–36.0)
MCV: 92.6 fL (ref 78.0–100.0)
MONO ABS: 0.8 10*3/uL (ref 0.1–1.0)
Monocytes Relative: 3 %
NEUTROS PCT: 93 %
Neutro Abs: 25.2 10*3/uL — ABNORMAL HIGH (ref 1.7–7.7)
PLATELETS: 326 10*3/uL (ref 150–400)
RBC: 4.21 MIL/uL (ref 3.87–5.11)
RDW: 14.4 % (ref 11.5–15.5)
WBC: 27.1 10*3/uL — ABNORMAL HIGH (ref 4.0–10.5)

## 2016-05-15 LAB — HEMOGLOBIN AND HEMATOCRIT, BLOOD
HEMATOCRIT: 38.9 % (ref 36.0–46.0)
Hemoglobin: 12.9 g/dL (ref 12.0–15.0)

## 2016-05-15 LAB — I-STAT CG4 LACTIC ACID, ED: Lactic Acid, Venous: 1.16 mmol/L (ref 0.5–1.9)

## 2016-05-15 LAB — PROTIME-INR
INR: 1.16
INR: 1.22
PROTHROMBIN TIME: 15.4 s — AB (ref 11.4–15.2)
Prothrombin Time: 14.9 seconds (ref 11.4–15.2)

## 2016-05-15 LAB — CBC
HEMATOCRIT: 38.8 % (ref 36.0–46.0)
HEMOGLOBIN: 12.9 g/dL (ref 12.0–15.0)
MCH: 30.7 pg (ref 26.0–34.0)
MCHC: 33.2 g/dL (ref 30.0–36.0)
MCV: 92.4 fL (ref 78.0–100.0)
Platelets: 355 10*3/uL (ref 150–400)
RBC: 4.2 MIL/uL (ref 3.87–5.11)
RDW: 14.2 % (ref 11.5–15.5)
WBC: 16.9 10*3/uL — AB (ref 4.0–10.5)

## 2016-05-15 LAB — TROPONIN I
TROPONIN I: 0.09 ng/mL — AB (ref <0.03)
TROPONIN I: 0.1 ng/mL — AB (ref <0.03)

## 2016-05-15 LAB — APTT: APTT: 29 s (ref 24–36)

## 2016-05-15 MED ORDER — FLUMAZENIL 0.5 MG/5ML IV SOLN
INTRAVENOUS | Status: AC
  Filled 2016-05-15: qty 5

## 2016-05-15 MED ORDER — LUBIPROSTONE 24 MCG PO CAPS: 24.0000 ug | ORAL_CAPSULE | Freq: Two times a day (BID) | ORAL | 0 refills | Status: DC

## 2016-05-15 MED ORDER — FENTANYL CITRATE (PF) 100 MCG/2ML IJ SOLN
INTRAMUSCULAR | Status: AC
  Filled 2016-05-15: qty 2

## 2016-05-15 MED ORDER — FENTANYL CITRATE (PF) 100 MCG/2ML IJ SOLN
INTRAMUSCULAR | Status: AC | PRN
  Administered 2016-05-15 (×2): 50 ug via INTRAVENOUS

## 2016-05-15 MED ORDER — SODIUM CHLORIDE 0.9 % IV SOLN
INTRAVENOUS | Status: DC
  Administered 2016-05-15: 10:00:00 via INTRAVENOUS

## 2016-05-15 MED ORDER — MILK AND MOLASSES ENEMA
1.0000 | Freq: Once | RECTAL | Status: AC
  Administered 2016-05-15: 250 mL via RECTAL
  Filled 2016-05-15: qty 250

## 2016-05-15 MED ORDER — SODIUM CHLORIDE 0.9 % IV SOLN
INTRAVENOUS | Status: DC
  Administered 2016-05-15: 18:00:00 via INTRAVENOUS

## 2016-05-15 MED ORDER — NALOXONE HCL 0.4 MG/ML IJ SOLN
INTRAMUSCULAR | Status: AC
  Filled 2016-05-15: qty 1

## 2016-05-15 MED ORDER — SODIUM CHLORIDE 0.9 % IV BOLUS (SEPSIS)
1000.0000 mL | Freq: Once | INTRAVENOUS | Status: AC
  Administered 2016-05-15: 1000 mL via INTRAVENOUS

## 2016-05-15 MED ORDER — MIDAZOLAM HCL 2 MG/2ML IJ SOLN
INTRAMUSCULAR | Status: AC
  Filled 2016-05-15: qty 4

## 2016-05-15 MED ORDER — IOPAMIDOL (ISOVUE-300) INJECTION 61%
100.0000 mL | Freq: Once | INTRAVENOUS | Status: AC | PRN
  Administered 2016-05-15: 75 mL via INTRAVENOUS

## 2016-05-15 MED ORDER — MIDAZOLAM HCL 2 MG/2ML IJ SOLN
INTRAMUSCULAR | Status: AC | PRN
  Administered 2016-05-15: 1 mg via INTRAVENOUS

## 2016-05-15 NOTE — Progress Notes (Signed)
  Radiation Oncology         [phone] ________________________________  Name: Sherri Tucker MRN: [id number]  Date: 05/15/2016  DOB: 11/08/1934  Simulation Verification Note    ICD-9-CM ICD-10-CM   1. Secondary malignant neoplasm of bone and bone marrow (HCC) 198.5 C79.51     C79.52     Status: outpatient  NARRATIVE: The patient was brought to the treatment unit and placed in the planned treatment position. The clinical setup was verified. Then port films were obtained and uploaded to the radiation oncology medical record software.  The treatment beams were carefully compared against the planned radiation fields. The position location and shape of the radiation fields was reviewed. They targeted volume of tissue appears to be appropriately covered by the radiation beams. Organs at risk appear to be excluded as planned.  Based on my personal review, I approved the simulation verification. The patient's treatment will proceed as planned.  -----------------------------------  Blair Promise, PhD, MD

## 2016-05-15 NOTE — Discharge Instructions (Signed)
Muscle Biopsy, Care After Refer to this sheet in the next few weeks. These instructions provide you with information on caring for yourself after your procedure. Your caregiver may also give you more specific instructions. Your treatment has been planned according to current medical practices, but problems sometimes occur. Call your caregiver if you have any problems or questions after your procedure.  HOME CARE INSTRUCTIONS   Only take over-the-counter or prescription medicines as directed by your caregiver.   Limit activities or movements as directed by your caregiver.   Take showers. Do not take baths until your caregiver approves.   Remove or change any bandages (dressings) only as directed by your caregiver. You may have skin adhesive strips over the biopsy site. Do not take the strips off. They will fall off on their own.  Keep all follow-up appointments with your caregiver. Ask when your test results will be ready. Make sure you get your test results.  SEEK MEDICAL CARE IF:   You have increased bleeding from the biopsy site.  You develop increasing redness, swelling, or pain in the area of the biopsy.   You have yellowish white fluid (pus) coming from the biopsy site.  You have a fever.  You notice a bad smell coming from the biopsy site or dressing.   You are light-headed or feel faint.  SEEK IMMEDIATE MEDICAL CARE IF:   You develop a rash.  You have difficulty breathing.  You have any reaction or side effects to medicines given. MAKE SURE YOU:  Understand these instructions.  Will watch your condition.  Will get help right away if you are not doing well or get worse. This information is not intended to replace advice given to you by your health care provider. Make sure you discuss any questions you have with your health care provider. Document Released: 12/13/2004 Document Revised: 06/16/2014 Document Reviewed: 02/27/2015 Elsevier Interactive Patient  Education  2017 [place]. Moderate Conscious Sedation, Adult, Care After These instructions provide you with information about caring for yourself after your procedure. Your health care provider may also give you more specific instructions. Your treatment has been planned according to current medical practices, but problems sometimes occur. Call your health care provider if you have any problems or questions after your procedure. What can I expect after the procedure? After your procedure, it is common:  To feel sleepy for several hours.  To feel clumsy and have poor balance for several hours.  To have poor judgment for several hours.  To vomit if you eat too soon. Follow these instructions at home: For at least 24 hours after the procedure:   Do not:  Participate in activities where you could fall or become injured.  Drive.  Use heavy machinery.  Drink alcohol.  Take sleeping pills or medicines that cause drowsiness.  Make important decisions or sign legal documents.  Take care of children on your own.  Rest. Eating and drinking  Follow the diet recommended by your health care provider.  If you vomit:  Drink water, juice, or soup when you can drink without vomiting.  Make sure you have little or no nausea before eating solid foods. General instructions  Have a responsible adult stay with you until you are awake and alert.  Take over-the-counter and prescription medicines only as told by your health care provider.  If you smoke, do not smoke without supervision.  Keep all follow-up visits as told by your health care provider. This is important. Contact a  health care provider if:  You keep feeling nauseous or you keep vomiting.  You feel light-headed.  You develop a rash.  You have a fever. Get help right away if:  You have trouble breathing. This information is not intended to replace advice given to you by your health care provider. Make sure you  discuss any questions you have with your health care provider. Document Released: 03/16/2013 Document Revised: 10/29/2015 Document Reviewed: 09/15/2015 Elsevier Interactive Patient Education  2017 [employer].

## 2016-05-15 NOTE — Progress Notes (Signed)
Received call from May, RN in Dr. Geralyn Flash office that Ms. Carias needs to go to the ED.  She has been having diarrhea and rectal bleeding today plus her pain is uncontrolled.  Transported patient via stretcher to the ED room 9.  Gave report to [place], Therapist, sports.

## 2016-05-15 NOTE — ED Notes (Signed)
This RN spoke with MD about troponin results. Per MD OK to D/C patient.

## 2016-05-15 NOTE — ED Provider Notes (Signed)
[facility] DEPT Provider Note   CSN: [id number] Arrival date & time: 05/15/16  1632     History   Chief Complaint No chief complaint on file.   HPI Sherri Tucker is a 81 y.o. female.  Pt presents to the ED today with bloody diarrhea.  The pt is normally constipated, so diarrhea is abnormal for her.  She has a hx of left breast upper-outer cancer diagnosed in 2011.  She had a lumpectomy and had recurrence recently with mets to left proximal humerus and right hip.  The pt had a biopsy of her left proximal humerus today.  She had radiation treatment to that area today as well.  Pt is on Eliquis because of a.fib.  CHADVASC score of 4.      Past Medical History:  Diagnosis Date  . Arthritis   . Arthritis 12/04/2011  . Breast cancer, stage 1 ([place]) 12/04/2011  . Cancer ([place])   . Colon polyp   . Complication of anesthesia   . Depression   . Hemorrhoids   . History of kidney stones   . History of radiation therapy 12/04/2011  . Hypertension   . PONV (postoperative nausea and vomiting)     Patient Active Problem List   Diagnosis Date Noted  . Goals of care, counseling/discussion 05/13/2016  . 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 Dehydration 05/06/2016  . Hypokalemia 05/06/2016  . Back pain 05/06/2016  . Atrial fibrillation with RVR ([place]) 11/24/2015  . HTN (hypertension) 11/24/2015  . 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 History of breast cancer 04/12/2012  . Breast cancer of upper-outer quadrant of left female breast ([place]) 12/04/2011  . Arthritis 12/04/2011  . History of radiation therapy 12/04/2011    Past Surgical History:  Procedure Laterality Date  . ABDOMINAL HYSTERECTOMY    . BREAST SURGERY  11   left lumpectomy  . TOTAL HIP ARTHROPLASTY Left 09/25/2014   Procedure: TOTAL HIP ARTHROPLASTY;  Surgeon: [name], MD;  Location: [facility];  Service: Orthopedics;  Laterality: Left;    OB History    No data available        Home Medications    Prior to Admission medications   Medication Sig Start Date End Date Taking? Authorizing Provider  acetaminophen (TYLENOL) 650 MG CR tablet Take 1,300 mg by mouth daily as needed for pain.   Yes Historical Provider, MD  anastrozole (ARIMIDEX) 1 MG tablet Take 1 tablet (1 mg total) by mouth daily. 05/08/16  Yes [name], MD  apixaban (ELIQUIS) 5 MG TABS tablet Take 1 tablet (5 mg total) by mouth 2 (two) times daily. Hospital providing 1 months coupon, after 1 month PCP to supply further refills. 01/02/16  Yes Eileen Stanford, PA-C  Cholecalciferol (VITAMIN D-3) 1000 UNITS CAPS Take 1,000 Units by mouth daily.   Yes Historical Provider, MD  fentaNYL (DURAGESIC - DOSED MCG/HR) 25 MCG/HR patch Place 1 patch (25 mcg total) onto the skin every 3 (three) days. 05/13/16  Yes [name], MD  hydrochlorothiazide (HYDRODIURIL) 25 MG tablet Take 25 mg by mouth daily.     Yes Historical Provider, MD  HYDROmorphone (DILAUDID) 2 MG tablet Take 1 tablet (2 mg total) by mouth every 4 (four) hours as needed for severe pain. 05/13/16  Yes [name], MD  lisinopril (PRINIVIL,ZESTRIL) 10 MG tablet Take 10 mg by mouth daily.     Yes Historical Provider, MD  metoprolol succinate (TOPROL-XL) 50 MG 24 hr tablet  Take 1 tablet (50 mg total) by mouth daily. 11/26/15  Yes Thurnell Lose, MD  Omega 3 1200 MG CAPS Take 1,200 mg by mouth every evening.   Yes Historical Provider, MD  potassium chloride SA (K-DUR,KLOR-CON) 20 MEQ tablet Take 1 tablet (20 mEq total) by mouth daily. 05/05/16  Yes [name], MD  sertraline (ZOLOFT) 50 MG tablet Take 25 mg by mouth at bedtime. Patient takes 1/2 tablet daily. Only taking 25 mg total a day. 04/28/11  Yes Historical Provider, MD  lidocaine (LIDODERM) 5 % Place 1 patch onto the skin daily. Remove & Discard patch within 12 hours or as directed by MD Patient not taking: Reported on 05/15/2016 05/12/16   [name], MD  lubiprostone (AMITIZA) 24 MCG  capsule Take 1 capsule (24 mcg total) by mouth 2 (two) times daily with a meal. 05/15/16   Isla Pence, MD    Family History Family History  Problem Relation Age of Onset  . Heart disease Sister     Social History Social History  Substance Use Topics  . Smoking status: Never Smoker  . Smokeless tobacco: Never Used  . Alcohol use No     Allergies   Codeine   Review of Systems Review of Systems  Gastrointestinal: Positive for abdominal pain, blood in stool and diarrhea.  All other systems reviewed and are negative.    Physical Exam Updated Vital Signs BP 169/94   Pulse 92   Resp 15   Ht 5\' 3"  (1.6 m)   SpO2 95%   Physical Exam  Constitutional: She is oriented to person, place, and time. She appears well-developed and well-nourished.  HENT:  Head: Normocephalic and atraumatic.  Right Ear: External ear normal.  Left Ear: External ear normal.  Nose: Nose normal.  Mouth/Throat: Oropharynx is clear and moist.  Eyes: Conjunctivae and EOM are normal. Pupils are equal, round, and reactive to light.  Neck: Normal range of motion. Neck supple.  Cardiovascular: Normal rate, regular rhythm, normal heart sounds and intact distal pulses.   Pulmonary/Chest: Effort normal and breath sounds normal.  Abdominal: Soft. Bowel sounds are normal.  Musculoskeletal: Normal range of motion.  Neurological: She is alert and oriented to person, place, and time.  Skin: Skin is warm and dry.  Psychiatric: She has a normal mood and affect. Her behavior is normal. Judgment and thought content normal.  Nursing note and vitals reviewed.    ED Treatments / Results  Labs (all labs ordered are listed, but only abnormal results are displayed) Labs Reviewed  COMPREHENSIVE METABOLIC PANEL - Abnormal; Notable for the following:       Result Value   Glucose, Bld 116 (*)    BUN 21 (*)    Calcium 8.7 (*)    Total Protein 6.4 (*)    Albumin 3.2 (*)    All other components within normal limits   CBC WITH DIFFERENTIAL/PLATELET - Abnormal; Notable for the following:    WBC 27.1 (*)    Neutro Abs 25.2 (*)    All other components within normal limits  PROTIME-INR - Abnormal; Notable for the following:    Prothrombin Time 15.4 (*)    All other components within normal limits  TROPONIN I - Abnormal; Notable for the following:    Troponin I 0.09 (*)    All other components within normal limits  TROPONIN I - Abnormal; Notable for the following:    Troponin I 0.10 (*)    All other components within normal  limits  HEMOGLOBIN AND HEMATOCRIT, BLOOD  POC OCCULT BLOOD, ED  I-STAT CG4 LACTIC ACID, ED    EKG  EKG Interpretation None       Radiology Ct Abdomen Pelvis W Contrast  Result Date: 05/15/2016 CLINICAL DATA:  Diarrhea, rectal bleeding EXAM: CT ABDOMEN AND PELVIS WITH CONTRAST TECHNIQUE: Multidetector CT imaging of the abdomen and pelvis was performed using the standard protocol following bolus administration of intravenous contrast. CONTRAST:  79mL ISOVUE-300 IOPAMIDOL (ISOVUE-300) INJECTION 61% COMPARISON:  06/13/2014 FINDINGS: Lower chest: Small left pleural effusion. Mild left basilar atelectasis. Hepatobiliary: No other focal liver abnormality is seen. No gallstones, gallbladder wall thickening, or biliary dilatation. Pancreas: Unremarkable. No pancreatic ductal dilatation or surrounding inflammatory changes. Spleen: Normal in size without focal abnormality. Adrenals/Urinary Tract: Stable 13 mm left adrenal mass. Multiple bilateral urolithiasis. 5 x 4 cm hypodense, fluid attenuating right anterior interpolar renal mass. 5 x 4.3 cm hypodense, fluid attenuating right inferior pole renal mass most consistent with a cyst. 3.6 x 3.6 cm hypodense, fluid attenuating left inferior pole renal mass. Bladder is unremarkable. Stomach/Bowel: Rectal fecal impaction with mild perirectal inflammatory change. Rectum measures 7.5 cm in diameter. No bowel dilatation to suggest obstruction. No  pneumatosis, pneumoperitoneum or portal venous gas. Vascular/Lymphatic: Abdominal aortic atherosclerosis. No lymphadenopathy. Reproductive: Status post hysterectomy. No adnexal masses. Other: No fluid collection or hematoma. Musculoskeletal: No lytic or sclerotic osseous lesion. Chronic compression fractures of the T12 and L1 vertebral bodies. Bilateral facet arthropathy throughout the lumbar spine. Total left hip arthroplasty. Severe osteoarthritis of the right hip. IMPRESSION: 1. Rectal fecal impaction. 2. Bilateral nephrolithiasis without hydronephrosis. 3. Bilateral renal cysts. 4. Chronic T12 and L1 vertebral body compression fractures. Electronically Signed   By: Kathreen Devoid   On: 05/15/2016 19:00   Ct Biopsy  Result Date: 05/15/2016 INDICATION: History of breast cancer, now with indeterminate destructive lytic lesion involving the proximal aspect of the left humerus. Please perform CT-guided biopsy for tissue diagnostic purposes. EXAM: CT GUIDED BIOPSY OF INDETERMINATE HYPERMETABOLIC LYTIC LESION INVOLVING THE PROXIMAL ASPECT OF THE LEFT HUMERUS. COMPARISON:  PET-CT - 05/07/2016; shoulder MRI - 04/10/2016 MEDICATIONS: None. ANESTHESIA/SEDATION: Fentanyl 100 mcg IV; Versed 1 mg IV Sedation time: 10 minutes; The patient was continuously monitored during the procedure by the interventional radiology nurse under my direct supervision. CONTRAST:  None. COMPLICATIONS: None immediate. PROCEDURE: Informed consent was obtained from the patient following an explanation of the procedure, risks, benefits and alternatives. A time out was performed prior to the initiation of the procedure. The patient was positioned supine on the CT table and a limited CT was performed for procedural planning demonstrating unchanged size and appearance of lytic, destructive lesion involving the proximal aspect of the left humerus. Dominant component about the is superior, medial aspects of the humerus, medial to the scapular was  targeted for biopsy given lesion location. Dominant component measures approximately 3.5 x 5.1 cm (image 27, series 2). The procedure was planned. The operative site was prepped and draped in the usual sterile fashion. Appropriate trajectory was confirmed with a 22 gauge spinal needle after the adjacent tissues were anesthetized with 1% Lidocaine with epinephrine. Under intermittent CT guidance, a 17 gauge coaxial needle was advanced into the peripheral aspect of the mass. Appropriate positioning was confirmed and 4 core needle biopsy samples were obtained with an 18 gauge core needle biopsy device. The co-axial needle was removed and hemostasis was achieved with manual compression. A limited postprocedural CT was negative for hemorrhage or additional complication.  A dressing was placed. The patient tolerated the procedure well without immediate postprocedural complication. IMPRESSION: Technically successful CT guided core needle biopsy of indeterminate lytic expansile lesion involving the proximal aspect of the left humerus. Electronically Signed   By: Sandi Mariscal M.D.   On: 05/15/2016 13:28    Procedures Procedures (including critical care time)  Medications Ordered in ED Medications  sodium chloride 0.9 % bolus 1,000 mL (0 mLs Intravenous Stopped 05/15/16 1832)    And  0.9 %  sodium chloride infusion ( Intravenous New Bag/Given 05/15/16 1803)  iopamidol (ISOVUE-300) 61 % injection 100 mL (75 mLs Intravenous Contrast Given 05/15/16 1827)  milk and molasses enema (250 mLs Rectal Given 05/15/16 1945)     Initial Impression / Assessment and Plan / ED Course  I have reviewed the triage vital signs and the nursing notes.  Pertinent labs & imaging results that were available during my care of the patient were reviewed by me and considered in my medical decision making (see chart for details).  Clinical Course     Pt feeling better after enema.  Hemoglobin is stable.  Troponin is stable.  Pt will be  started on Amitiza to see if that will help the opioid induced constipation.  The pt encouraged to eat a high fiber diet.  She is encouraged to return if worse.  Final Clinical Impressions(s) / ED Diagnoses   Final diagnoses:  Fecal impaction ([place])  Constipation due to opioid therapy  Malignant neoplasm of upper-outer quadrant of left female breast, unspecified estrogen receptor status ([place])  Chronic pain due to neoplasm    New Prescriptions New Prescriptions   LUBIPROSTONE (AMITIZA) 24 MCG CAPSULE    Take 1 capsule (24 mcg total) by mouth 2 (two) times daily with a meal.     Isla Pence, MD 05/15/16 2119

## 2016-05-15 NOTE — ED Notes (Signed)
Informed MD of troponin results.

## 2016-05-15 NOTE — ED Notes (Signed)
Couldn't complete orthostatic vitals because patient had a bowel movement and felt weak after I cleaned her up.

## 2016-05-15 NOTE — H&P (Signed)
Chief Complaint: Shoulder pain Hypermetabolic area left subscapularis History of breast cancer  Referring Physician(s): Gudena,Vinay  Supervising Physician: Sandi Mariscal  Patient Status: [facility] - Out-pt  History of Present Illness: Sherri Tucker is a 81 y.o. female widespread bony metastatic disease who was seen by Dr. [name] because of worsening pain in the shoulder.   PET shows marked hypermetabolism corresponding to the proximal left humerus lesion with soft tissue component and pathologic fracture.  At the shoulder the fracture and soft tissue mass is causing her severe pain.  She is having to take MS Contin for the pain.  She is here today for image guided biopsy of the muscle/soft tissue area.  She is NPO. She held her Eliquis since Monday which she takes for Afib  She is c/o diarrhea today secondary to taking 2 laxatives to help with constipation caused by her MS contin.  Nursing reports bright red blood in stool today.  Past Medical History:  Diagnosis Date  . Arthritis   . Arthritis 12/04/2011  . Breast cancer, stage 1 ([place]) 12/04/2011  . Cancer ([place])   . Colon polyp   . Complication of anesthesia   . Depression   . Hemorrhoids   . History of kidney stones   . History of radiation therapy 12/04/2011  . Hypertension   . PONV (postoperative nausea and vomiting)     Past Surgical History:  Procedure Laterality Date  . ABDOMINAL HYSTERECTOMY    . BREAST SURGERY  11   left lumpectomy  . TOTAL HIP ARTHROPLASTY Left 09/25/2014   Procedure: TOTAL HIP ARTHROPLASTY;  Surgeon: [name], MD;  Location: [facility];  Service: Orthopedics;  Laterality: Left;    Allergies: Codeine  Medications: Prior to Admission medications   Medication Sig Start Date End Date Taking? Authorizing Provider  acetaminophen (TYLENOL) 650 MG CR tablet Take 1,300 mg by mouth daily as needed for pain.   Yes Historical Provider, MD  anastrozole (ARIMIDEX) 1 MG tablet Take 1 tablet (1 mg  total) by mouth daily. 05/08/16  Yes [name], MD  apixaban (ELIQUIS) 5 MG TABS tablet Take 1 tablet (5 mg total) by mouth 2 (two) times daily. Hospital providing 1 months coupon, after 1 month PCP to supply further refills. 01/02/16   Eileen Stanford, PA-C  Cholecalciferol (VITAMIN D-3) 1000 UNITS CAPS Take 1,000 Units by mouth daily.    Historical Provider, MD  fentaNYL (DURAGESIC - DOSED MCG/HR) 25 MCG/HR patch Place 1 patch (25 mcg total) onto the skin every 3 (three) days. Patient not taking: Reported on 05/13/2016 05/13/16   [name], MD  Glucosamine-Chondroit-Vit C-Mn (GLUCOSAMINE 1500 COMPLEX) CAPS Take 1 capsule by mouth daily.    Historical Provider, MD  hydrochlorothiazide (HYDRODIURIL) 25 MG tablet Take 25 mg by mouth daily.      Historical Provider, MD  HYDROmorphone (DILAUDID) 2 MG tablet Take 1 tablet (2 mg total) by mouth every 4 (four) hours as needed for severe pain. Patient not taking: Reported on 05/13/2016 05/13/16   [name], MD  lidocaine (LIDODERM) 5 % Place 1 patch onto the skin daily. Remove & Discard patch within 12 hours or as directed by MD Patient not taking: Reported on 05/13/2016 05/12/16   [name], MD  lisinopril (PRINIVIL,ZESTRIL) 10 MG tablet Take 10 mg by mouth daily.      Historical Provider, MD  metoprolol succinate (TOPROL-XL) 50 MG 24 hr tablet Take 1 tablet (50 mg total) by mouth daily. 11/26/15  Thurnell Lose, MD  Omega 3 1200 MG CAPS Take 1,200 mg by mouth every evening.    Historical Provider, MD  potassium chloride SA (K-DUR,KLOR-CON) 20 MEQ tablet Take 1 tablet (20 mEq total) by mouth daily. 05/05/16   [name], MD  sertraline (ZOLOFT) 50 MG tablet Take 25 mg by mouth at bedtime. Patient takes 1/2 tablet daily. Only taking 25 mg total a day. 04/28/11   Historical Provider, MD     Family History  Problem Relation Age of Onset  . Heart disease Sister     Social History   Social History  . Marital status: Widowed    Spouse  name: [name]  . Number of children: [name]  . Years of education: [name]   Social History Main Topics  . Smoking status: Never Smoker  . Smokeless tobacco: Never Used  . Alcohol use No  . Drug use: No  . Sexual activity: Not Currently   Other Topics Concern  . None   Social History Narrative  . None    Review of Systems: A 12 point ROS discussed  Review of Systems  Constitutional: Positive for activity change. Negative for appetite change, chills, fatigue and fever.  HENT: Negative.   Respiratory: Negative.   Cardiovascular: Negative.   Gastrointestinal: Positive for blood in stool, constipation and diarrhea.  Genitourinary: Negative.   Musculoskeletal: Negative.   Skin: Negative.   Neurological: Negative.   Hematological: Negative.     Vital Signs: BP (!) 174/81   Pulse 91   Temp 98.4 F (36.9 C) (Oral)   Resp 18   SpO2 96%   Physical Exam  Constitutional: She is oriented to person, place, and time. She appears well-developed and well-nourished.  HENT:  Head: Normocephalic and atraumatic.  Eyes: EOM are normal.  Neck: Normal range of motion.  Cardiovascular: Normal rate, regular rhythm and normal heart sounds.   Pulmonary/Chest: Effort normal and breath sounds normal. No respiratory distress.  Abdominal: Soft. She exhibits no distension. There is no tenderness.  Musculoskeletal: Normal range of motion.  Neurological: She is alert and oriented to person, place, and time.  Skin: Skin is warm and dry.  Psychiatric: She has a normal mood and affect. Her behavior is normal. Judgment and thought content normal.  Vitals reviewed.   Mallampati Score:  MD Evaluation Airway: WNL Heart: WNL Abdomen: WNL Chest/ Lungs: WNL ASA  Classification: 3 Mallampati/Airway Score: Two  Imaging: Nm Pet Image Restag (ps) Skull Base To Thigh  Result Date: 05/07/2016 CLINICAL DATA:  Initial Treatment strategy for left-sided breast cancer. Left humeral mass. Stage I breast cancer in  2013. EXAM: NUCLEAR MEDICINE PET SKULL BASE TO THIGH TECHNIQUE: 5.3 mCi F-18 FDG was injected intravenously. Full-ring PET imaging was performed from the skull base to thigh after the radiotracer. CT data was obtained and used for attenuation correction and anatomic localization. FASTING BLOOD GLUCOSE:  Value: 105 mg/dl COMPARISON:  Left shoulderMRI OF 04/10/16. Abdominal pelvic CT/stone study of 06/13/2014. FINDINGS: NECK Relative hypermetabolism involving the periphery of the left cerebellar hemisphere may relate to atrophy, given suggestion of hypoattenuation in this area on image 13/series 3. No cervical nodal hypermetabolism. No cervical adenopathy. CHEST No thoracic nodal or pulmonary parenchymal hypermetabolism. Mild cardiomegaly. Small bilateral pleural effusions. No axillary adenopathy. Bibasilar atelectasis. ABDOMEN/PELVIS 11 mm left adrenal nodule measures a S.U.V. max of 4.3 on image 94/series 4. This is low in density, favoring an adenoma. No other soft tissue hypermetabolism identified, including within the abdominal pelvic  nodal stations or parenchymal organs. Bilateral renal cysts and collecting system calculi. Large volume lower pole right renal collecting system stones. Aortic and branch vessel atherosclerosis. Degraded evaluation of the pelvis, secondary to beam hardening artifact from left hip arthroplasty. SKELETON Marked hypermetabolism corresponding to the proximal left humerus lesion with soft tissue component and pathologic fracture. This measures a S.U.V. max of 23.4. Suspected osseous lesion at the right skullbase, a focus of hypermetabolism measuring a S.U.V. max of 9.4 on image 16/ series 4. Multifocal pelvic hypermetabolism, including a lesion about the posterior and superior aspect of the right acetabulum. This measures a S.U.V. max of 10.6. Hypermetabolism is identified about the periphery of the L1 vertebral body, which has undergone interval compression deformity since the CT of  06/13/2014. There is also chronic compression deformity of T12. Superior T9 hypermetabolic metastasis. Left hip arthroplasty.  Right hip osteoarthritis.  Osteopenia. IMPRESSION: 1. Multifocal hypermetabolic osseous lesions, consistent with metastasis. The dominant lesion in the left proximal humerus is significantly hypermetabolic. In addition, there is L1 hypermetabolism with presumed secondary interval compression deformity. A T12 compression deformity is chronic. 2. Hypermetabolic left adrenal nodule is favored to represent an adenoma, given low-density. Otherwise, no evidence of soft Tissue metastasis. 3. Small bilateral pleural effusions. 4. Bilateral nephrolithiasis. 5. Suspect focal encephalomalacia involving the left cerebellar hemisphere with resultant hypometabolism. Electronically Signed   By: Abigail Miyamoto M.D.   On: 05/07/2016 15:43    Labs:  CBC:  Recent Labs  11/26/15 0509 01/02/16 1625 05/05/16 1220 05/15/16 0948  WBC 10.6* 9.0 14.4* 16.9*  HGB 10.4* 13.0 13.0 12.9  HCT 31.1* 40.2 37.9 38.8  PLT 116* 252 284 355    COAGS:  Recent Labs  11/24/15 2300 05/15/16 0948  INR 1.25 1.16  APTT  --  29    BMP:  Recent Labs  11/24/15 1658 11/25/15 0324 01/02/16 1625 05/05/16 1220  NA 136 136 139 134*  K 3.4* 3.7 3.7 2.8*  CL 102 104 100  --   CO2 20* 21* 26 30*  GLUCOSE 172* 147* 96 120  BUN 23* 23* 23 21.0  CALCIUM 10.3 9.1 9.8 9.8  CREATININE 1.01* 0.94 1.17* 0.8  GFRNONAA 52* 57*  --   --   GFRAA >60 >60  --   --     LIVER FUNCTION TESTS:  Recent Labs  11/24/15 1658 05/05/16 1220  BILITOT 0.7 0.53  AST 22 10  ALT 12* 6  ALKPHOS 63 67  PROT 7.9 7.2  ALBUMIN 4.1 3.0*    TUMOR MARKERS: No results for input(s): AFPTM, CEA, CA199, CHROMGRNA in the last 8760 hours.  Assessment and Plan:  PET shows marked hypermetabolism corresponding to the proximal left humerus lesion with soft tissue component and pathologic fracture.  Will proceed with image  guided biopsy today  By Dr. Pascal Lux.  Risks and Benefits discussed with the patient including, but not limited to bleeding, infection, damage to adjacent structures or low yield requiring additional tests.  All of the patient's questions were answered, patient is agreeable to proceed. Consent signed and in chart.  Thank you for this interesting consult.  I greatly enjoyed meeting Sherri Tucker and look forward to participating in their care.  A copy of this report was sent to the requesting provider on this date.  Electronically Signed: Murrell Redden PA-C 05/15/2016, 12:10 PM   I spent a total of  30 Minutes in face to face in clinical consultation, greater than 50% of which  was counseling/coordinating care for CT guided biopsy

## 2016-05-15 NOTE — Progress Notes (Signed)
Nurse from short stay came over to let MD know that pt is having a lot of pain from her back and had been having loose bloody bm since 9am this morning. Pt had taken 2 colace yesterday for constipation. Labs stable from today's result. S/p ct biopsy of shoulder. Pt currently getting radiation treatment. Family stressed that they do not feel safe taking the pt home and watch their mom suffer in pain. They feel that she needs to be managed in the ED. Oral and topical pain medication not working at this time. Family and pt tearful. Dr.Gudena aware and called ED, spoke with charge RN, that pt will be coming to be evaluated for pain management. Santiago Glad from [facility] aware and will be taking pt and family to ED via stretcher. Palliative and [facility] nursing consulted and will be in contact with pt son, Gershon Mussel.

## 2016-05-15 NOTE — Progress Notes (Signed)
  Nursing called and reported another loose BM with red blood.  Rectal exam reveals external hemorrhoids. No active bleeding seen.  Patient reports known history of bleeding hemorrhoids in the past.  Her Hgb is stable.  I instructed her to hold her Eliquis and extra day to see if this bleeding resolves.  She understands if the bleeding worsens or continues, she is to seek medical treatment immediately.  I discussed this with her son and they both understand.  Judie Grieve BLAIR PA-C 05/15/2016 12:42 PM

## 2016-05-15 NOTE — ED Notes (Signed)
Patient d/c'd self care.  F/U and medications discussed.  Patient verbalized understanding. 

## 2016-05-15 NOTE — Procedures (Signed)
Technically successful CT guided biopsy of lytic lesion involving the left humeral head.  EBL: None.  No immediate post procedural complications.   Ronny Bacon, MD Pager #: [phone]

## 2016-05-15 NOTE — Progress Notes (Signed)
Patient has appointment with radiation oncology at 5 PM. She has had biopsy today and has been discharged from care. To stay in room 1315 until her appointment due to diarrhea and chronic pain level. Family is instructed to call for help if they need it.

## 2016-05-15 NOTE — Progress Notes (Signed)
Received patient from radiology post left subscapularis muscle biopsy. She requests the bedpan. Frank blood mixed with stool. As per RN who got her ready, patient had been constipated for 3 days and took laxative last night. She does have history of hemmorhoids.

## 2016-05-15 NOTE — ED Triage Notes (Signed)
Patient has had diarrhea with a small amount of blood in her stool today. Started today. Patient was having a radiation treatment and they sent her over here to be evaluated.

## 2016-05-15 NOTE — ED Notes (Addendum)
Patient is getting dressed in room.

## 2016-05-15 NOTE — ED Notes (Signed)
Bed: WA09 Expected date:  Expected time:  Means of arrival:  Comments: 

## 2016-05-16 ENCOUNTER — Ambulatory Visit: Payer: Medicare Other

## 2016-05-16 ENCOUNTER — Encounter: Payer: Self-pay | Admitting: Hematology and Oncology

## 2016-05-16 NOTE — Progress Notes (Signed)
Submitted PA request for Lidocaine patch via [facility] over phone(Latondra). Request has been submitted to clinical department for review. Decision will be sent via fax.

## 2016-05-19 ENCOUNTER — Ambulatory Visit
Admission: RE | Admit: 2016-05-19 | Discharge: 2016-05-19 | Disposition: A | Discharge status: Home / Self Care | Payer: Medicare Other | Source: Ambulatory Visit | Attending: Radiation Oncology | Admitting: Radiation Oncology

## 2016-05-19 ENCOUNTER — Ambulatory Visit: Payer: Medicare Other

## 2016-05-19 ENCOUNTER — Ambulatory Visit: Payer: Self-pay

## 2016-05-19 ENCOUNTER — Ambulatory Visit: Payer: Medicare Other | Admitting: Radiation Oncology

## 2016-05-19 DIAGNOSIS — Z510 Encounter for antineoplastic radiation therapy: Secondary | ICD-10-CM | POA: Diagnosis not present

## 2016-05-20 ENCOUNTER — Telehealth: Payer: Self-pay

## 2016-05-20 ENCOUNTER — Encounter: Payer: Self-pay | Admitting: Radiation Oncology

## 2016-05-20 ENCOUNTER — Ambulatory Visit: Payer: Medicare Other

## 2016-05-20 ENCOUNTER — Ambulatory Visit
Admission: RE | Admit: 2016-05-20 | Discharge: 2016-05-20 | Disposition: A | Discharge status: Home / Self Care | Payer: Medicare Other | Source: Ambulatory Visit | Attending: Radiation Oncology | Admitting: Radiation Oncology

## 2016-05-20 ENCOUNTER — Other Ambulatory Visit: Payer: Self-pay

## 2016-05-20 VITALS — BP 134/98 | HR 132 | Temp 97.6°F

## 2016-05-20 DIAGNOSIS — Z923 Personal history of irradiation: Secondary | ICD-10-CM | POA: Insufficient documentation

## 2016-05-20 DIAGNOSIS — R300 Dysuria: Secondary | ICD-10-CM | POA: Diagnosis present

## 2016-05-20 DIAGNOSIS — C7952 Secondary malignant neoplasm of bone marrow: Secondary | ICD-10-CM | POA: Diagnosis not present

## 2016-05-20 DIAGNOSIS — Z510 Encounter for antineoplastic radiation therapy: Secondary | ICD-10-CM | POA: Diagnosis not present

## 2016-05-20 DIAGNOSIS — C7951 Secondary malignant neoplasm of bone: Secondary | ICD-10-CM

## 2016-05-20 DIAGNOSIS — N390 Urinary tract infection, site not specified: Secondary | ICD-10-CM

## 2016-05-20 LAB — URINALYSIS, ROUTINE W REFLEX MICROSCOPIC
BACTERIA UA: NONE SEEN
Bilirubin Urine: NEGATIVE
GLUCOSE, UA: NEGATIVE mg/dL
Ketones, ur: NEGATIVE mg/dL
Leukocytes, UA: NEGATIVE
NITRITE: NEGATIVE
PROTEIN: NEGATIVE mg/dL
Specific Gravity, Urine: 1.012 (ref 1.005–1.030)
pH: 7 (ref 5.0–8.0)

## 2016-05-20 MED ORDER — CIPROFLOXACIN HCL 500 MG PO TABS: 500.0000 mg | ORAL_TABLET | Freq: Two times a day (BID) | ORAL | 0 refills | Status: DC

## 2016-05-20 MED ORDER — SULFAMETHOXAZOLE-TRIMETHOPRIM 800-160 MG PO TABS: 1.0000 | ORAL_TABLET | Freq: Two times a day (BID) | ORAL | 0 refills | Status: DC

## 2016-05-20 NOTE — Progress Notes (Signed)
  Radiation Oncology         [phone] ________________________________  Name: Sherri Tucker MRN: [id number]  Date: 05/20/2016  DOB: 1935-05-30  Weekly Radiation Therapy Management    ICD-9-CM ICD-10-CM   1. Dysuria 788.1 R30.0 Urinalysis, Routine w reflex microscopic     Urine culture  2. Secondary malignant neoplasm of bone and bone marrow (HCC) 198.5 C79.51     C79.52      Current Dose: 12 Gy     Planned Dose:  20 Gy  Narrative . . . . . . . . The patient presents for routine under treatment assessment.  The patient has completed 3 fractions to her left shoulder and right hip. Her heart rate is elevated today at 132. She reports the pain in her left shoulder is tolerable, and rates the pain in her left hip as 8/10 in severity. She reports dysuria that started yesterday; Dr. Geralyn Flash office will have her provide a urine sample today.                                    The patient is without complaint.                                 Set-up films were reviewed.                                 The chart was checked. Physical Findings. . .  oral temperature is 97.6 F (36.4 C). Her blood pressure is 134/98 (abnormal) and her pulse is 132 (abnormal). Her oxygen saturation is 100%. Weight essentially stable.  No significant changes. Impression . . . . . . . The patient is tolerating radiation. The patient presents in wheelchair today. Plan . . . . . . . . . . . . Continue treatment as planned. I recommended for the patient to take a dose of dilaudid approximately an hour before she comes in for treatment to reduce her pain.  ________________________________   Blair Promise, PhD, MD  This document serves as a record of services personally performed by Gery Pray, MD. It was created on his behalf by Maryla Morrow, a trained medical scribe. The creation of this record is based on the scribe's personal observations and the provider's statements to them. This document has been  checked and approved by the attending provider.

## 2016-05-20 NOTE — Telephone Encounter (Signed)
Pt daughter in law, Shirlean Mylar called stating that the pt had been having some burning during urination for a few days now and suspecting that she may have a UTI. Pt is also having pain in her lower back and legs when she tries to get up and move around. Shirlean Mylar states that the pt's pain is tolerable when she is laying down, but the pain medication that she's been taking doesn't seem to help. Pt cries when she has to get up and is feeling weak. Pt is scheduled today to have rad tx. Daughter in law anxious to see MD on Thursday to discuss biopsy results.   Told Robin that per Dr.Gudena, will be calling in escript for antibiotic Cipro BID x 7days. Verified preferred pharmacy w/ daughter. Will also need to obtain UA specimen if pt able to provide sample. Sent labels to be obtained in radonc, so pt does not need to come to lab and wait. Pt son can drop it off the lab when they leave. Left vm for Santiago Glad in [place]. Shirlean Mylar voiced understanding and will make sure to let Gershon Mussel (pt son) know when he brings pt in today.

## 2016-05-20 NOTE — Progress Notes (Addendum)
Sherri Tucker has completed 3 fractions to her left shoulder and right hip.  She reports the pain in her left shoulder is tolerable and is rating the pain in her left hip at an 8/10.  She reports having dysuria that started yesterday.  Dr. Geralyn Flash office would like her to provide a urine sample today.  Her heart rate is elevated today at 132.  BP (!) 134/98 (BP Location: Right Arm, Patient Position: Sitting)   Pulse (!) 132   Temp 97.6 F (36.4 C) (Oral)   SpO2 100%    Wt Readings from Last 3 Encounters:  05/13/16 106 lb 14.4 oz (48.5 kg)  05/08/16 107 lb 12.8 oz (48.9 kg)  05/05/16 106 lb 14.4 oz (48.5 kg)    .

## 2016-05-21 ENCOUNTER — Ambulatory Visit: Payer: Medicare Other

## 2016-05-21 NOTE — Assessment & Plan Note (Signed)
Left breast invasive ductal carcinoma T1 C. N0 M0 stage IA grade 1 ER/PR positive HER-2 negative Ki-67 8% 2 SLN negative status post lumpectomy In December 2011and radiation. Patient refusedantiestrogen therapy and is currently on observation.  November 2017 Left shoulder MRI: Increased activity in the left humeral head bone marrow, left glenohumeral soft tissue mass measuring 3.8 cm. Is a suspicious for metastatic cancer versus primary bone tumors like chondrosarcoma.  PET-CT scan: 05/07/16: Multifocal bone mets. Prom Left humerus, L-1 compression deformity, right skull base, multifocal pelvic hypermetabolism, T-9 , T 12 mets  Plan: Biopsy the shoulder mass to be done 05/15/16 Refer for palliative XRT: Start today RTC after biopsy to discuss treatment plan.  Pain Management: I discontinued MSIR and MS Contin. I sent her with a prescription for fentanyl 25 g patch. I also gave her a dose of Dilaudid intravenously today in the clinic. We sent her with a prescription of Dilaudid 2 mg every 4 hours as needed for pain. We are really hoping that the radiation therapy will ease her pain. I offered the patient hospitalization to get the pain under control but she refused.  Palliative care consult  Current Treatment: anastrozole started 04/28/16 . If it is confirmed to be ER positive breast cancer, we will need to add Ibrance to her treatment  Biopsy: Malignancy couldn't be easily classified. Sent out for outside review

## 2016-05-22 ENCOUNTER — Ambulatory Visit: Payer: Medicare Other

## 2016-05-22 ENCOUNTER — Ambulatory Visit (HOSPITAL_BASED_OUTPATIENT_CLINIC_OR_DEPARTMENT_OTHER): Payer: Medicare Other | Admitting: Hematology and Oncology

## 2016-05-22 ENCOUNTER — Encounter: Payer: Self-pay | Admitting: Hematology and Oncology

## 2016-05-22 ENCOUNTER — Ambulatory Visit
Admission: RE | Admit: 2016-05-22 | Discharge: 2016-05-22 | Disposition: A | Discharge status: Home / Self Care | Payer: Medicare Other | Source: Ambulatory Visit | Attending: Radiation Oncology | Admitting: Radiation Oncology

## 2016-05-22 ENCOUNTER — Other Ambulatory Visit: Payer: Self-pay

## 2016-05-22 VITALS — BP 178/82 | HR 72 | Temp 98.0°F | Resp 18 | Ht 63.0 in | Wt 101.0 lb

## 2016-05-22 DIAGNOSIS — C50412 Malignant neoplasm of upper-outer quadrant of left female breast: Secondary | ICD-10-CM

## 2016-05-22 DIAGNOSIS — C7951 Secondary malignant neoplasm of bone: Secondary | ICD-10-CM

## 2016-05-22 DIAGNOSIS — C7952 Secondary malignant neoplasm of bone marrow: Principal | ICD-10-CM

## 2016-05-22 DIAGNOSIS — Z170 Estrogen receptor positive status [ER+]: Secondary | ICD-10-CM

## 2016-05-22 DIAGNOSIS — Z66 Do not resuscitate: Secondary | ICD-10-CM | POA: Insufficient documentation

## 2016-05-22 DIAGNOSIS — Z510 Encounter for antineoplastic radiation therapy: Secondary | ICD-10-CM | POA: Diagnosis not present

## 2016-05-22 LAB — URINE CULTURE

## 2016-05-22 MED ORDER — FENTANYL 50 MCG/HR TD PT72: 50.0000 ug | MEDICATED_PATCH | TRANSDERMAL | 0 refills | Status: AC

## 2016-05-22 MED ORDER — LIDOCAINE 5 % EX PTCH: 1.0000 | MEDICATED_PATCH | CUTANEOUS | 0 refills | Status: AC

## 2016-05-22 NOTE — Progress Notes (Signed)
Hospice referral made and called in. Son's number provided as primary contact.

## 2016-05-22 NOTE — Progress Notes (Signed)
Patient Care Team: Maurice Small, MD as PCP - General (Family Medicine)  DIAGNOSIS:  Encounter Diagnosis  Name Primary?  . Malignant neoplasm of upper-outer quadrant of left breast in female, estrogen receptor positive ([place])     SUMMARY OF ONCOLOGIC HISTORY:   Breast cancer of upper-outer quadrant of left female breast ([place])   05/23/2010 Surgery    Left breast lumpectomy 1.5 cm grade 1 stage IA T1 C. N0 M0 invasive ductal carcinoma ER/PR positive HER-2 negative Ki-67 80%, 2 SLN negative      06/08/2010 - 08/05/2010 Radiation Therapy    Radiation therapy to the left breast lumpectomy      07/23/2010 -  Anti-estrogen oral therapy    Patient declined antiestrogen therapy      04/22/2016 -  Anti-estrogen oral therapy    Anastrozole 2.5 mg daily      05/07/2016 PET scan    Multifocal hypermetabolic bone lesions consistent with bone metastases, left proximal humerus with soft tissue component and pathologic fracture SUV 23.4, right skull base, pelvic lesions especially right acetabulum, L1, T12, T9 bone metastases       CHIEF COMPLIANT: Follow-up after recent biopsy, complaining of severe pain in the right thigh  INTERVAL HISTORY: Sherri Tucker is a 81 year old with above-mentioned history metastatic cancer involving multiple bones including the left humerus and multiple pelvic lesions. She is currently undergoing palliative radiation to the shoulder. It appears that the pain has improved significantly. In the shoulder but she continues to have severe pain in the right thigh. When she is sedentary and nonambulatory the pain is not bad but whenever she most the pain is intractable. She is currently on fentanyl 25 g along with Dilaudid 2 mg every 4 hours as needed. It appears that the patient is not well controlled on this regimen. She continues to feel weaker and has not been eating much food. She is getting more tired and requiring more help from her son.  REVIEW OF SYSTEMS:     Constitutional: Denies fevers, chills or abnormal weight loss, worsening fatigue and generalized weakness Eyes: Denies blurriness of vision Ears, nose, mouth, throat, and face: Denies mucositis or sore throat Respiratory: Denies cough, dyspnea or wheezes Cardiovascular: Denies palpitation, chest discomfort Gastrointestinal:  Denies nausea, heartburn or change in bowel habits Skin: Denies abnormal skin rashes Lymphatics: Denies new lymphadenopathy or easy bruising Neurological:Denies numbness, tingling or new weaknesses Behavioral/Psych: Mood is stable, no new changes  Extremities: Severe pain in the right hip  All other systems were reviewed with the patient and are negative.  I have reviewed the past medical history, past surgical history, social history and family history with the patient and they are unchanged from previous note.  ALLERGIES:  is allergic to codeine.  MEDICATIONS:  Current Outpatient Prescriptions  Medication Sig Dispense Refill  . anastrozole (ARIMIDEX) 1 MG tablet Take 1 tablet (1 mg total) by mouth daily. 90 tablet 3  . fentaNYL (DURAGESIC - DOSED MCG/HR) 50 MCG/HR Place 1 patch (50 mcg total) onto the skin every 3 (three) days. 10 patch 0  . hydrochlorothiazide (HYDRODIURIL) 25 MG tablet Take 25 mg by mouth daily.      Marland Kitchen HYDROmorphone (DILAUDID) 2 MG tablet Take 1 tablet (2 mg total) by mouth every 4 (four) hours as needed for severe pain. 120 tablet 0  . lidocaine (LIDODERM) 5 % Place 1 patch onto the skin daily. Remove & Discard patch within 12 hours or as directed by MD 30 patch 0  .  lisinopril (PRINIVIL,ZESTRIL) 10 MG tablet Take 10 mg by mouth daily.      . metoprolol succinate (TOPROL-XL) 50 MG 24 hr tablet Take 1 tablet (50 mg total) by mouth daily. 30 tablet 0  . sertraline (ZOLOFT) 50 MG tablet Take 25 mg by mouth at bedtime. Patient takes 1/2 tablet daily. Only taking 25 mg total a day.     No current facility-administered medications for this visit.      PHYSICAL EXAMINATION: ECOG PERFORMANCE STATUS: 3 - Symptomatic, >50% confined to bed  Vitals:   05/22/16 1448  BP: (!) 178/82  Pulse: 72  Resp: 18  Temp: 98 F (36.7 C)   Filed Weights   05/22/16 1448  Weight: 101 lb (45.8 kg)    GENERAL:alert, no distress and comfortable SKIN: skin color, texture, turgor are normal, no rashes or significant lesions EYES: normal, Conjunctiva are pink and non-injected, sclera clear OROPHARYNX:no exudate, no erythema and lips, buccal mucosa, and tongue normal  NECK: supple, thyroid normal size, non-tender, without nodularity LYMPH:  no palpable lymphadenopathy in the cervical, axillary or inguinal LUNGS: clear to auscultation and percussion with normal breathing effort HEART: regular rate & rhythm and no murmurs and no lower extremity edema ABDOMEN:abdomen soft, non-tender and normal bowel sounds MUSCULOSKELETAL:no cyanosis of digits and no clubbing  NEURO: alert & oriented x 3 with fluent speech, no focal motor/sensory deficits EXTREMITIES: No lower extremity edema   LABORATORY DATA:  I have reviewed the data as listed   Chemistry      Component Value Date/Time   NA 138 05/15/2016 1724   NA 134 (L) 05/05/2016 1220   K 3.6 05/15/2016 1724   K 2.8 (LL) 05/05/2016 1220   CL 104 05/15/2016 1724   CO2 24 05/15/2016 1724   CO2 30 (H) 05/05/2016 1220   BUN 21 (H) 05/15/2016 1724   BUN 21.0 05/05/2016 1220   CREATININE 0.70 05/15/2016 1724   CREATININE 0.8 05/05/2016 1220      Component Value Date/Time   CALCIUM 8.7 (L) 05/15/2016 1724   CALCIUM 9.8 05/05/2016 1220   ALKPHOS 56 05/15/2016 1724   ALKPHOS 67 05/05/2016 1220   AST 19 05/15/2016 1724   AST 10 05/05/2016 1220   ALT 16 05/15/2016 1724   ALT 6 05/05/2016 1220   BILITOT 0.9 05/15/2016 1724   BILITOT 0.53 05/05/2016 1220       Lab Results  Component Value Date   WBC 27.1 (H) 05/15/2016   HGB 12.9 05/15/2016   HCT 38.9 05/15/2016   MCV 92.6 05/15/2016   PLT 326  05/15/2016   NEUTROABS 25.2 (H) 05/15/2016    ASSESSMENT & PLAN:  Breast cancer of upper-outer quadrant of left female breast (HCC) Left breast invasive ductal carcinoma T1 C. N0 M0 stage IA grade 1 ER/PR positive HER-2 negative Ki-67 8% 2 SLN negative status post lumpectomy In December 2011and radiation. Patient refusedantiestrogen therapy and is currently on observation.  November 2017 Left shoulder MRI: Increased activity in the left humeral head bone marrow, left glenohumeral soft tissue mass measuring 3.8 cm. Is a suspicious for metastatic cancer versus primary bone tumors like chondrosarcoma.  PET-CT scan: 05/07/16: Multifocal bone mets. Prom Left humerus, L-1 compression deformity, right skull base, multifocal pelvic hypermetabolism, T-9 , T 12 mets  Plan:  1. Biopsy the shoulder mass done 05/15/16: The biopsy suggests malignancy but the pathologist was unable to identify the subtype because the cancer cells appeared to not have any immunohistochemical stains that can identify  the primary of the cancer. This specimen has been sent to an external lab for further evaluation. He did not have estrogen receptors. Goals of care: I discussed with her about the progression of her disease from the cancer. She is progressively getting weaker and I suggested to her that we should involve hospice to help her with additional services to be done at home. I reviewed her list of medications and eliminated a lot of medications. After much discussion patient agreed to DO NOT RESUSCITATE CC.   Pain Management: I increase the dosage of fentanyl to 50 g. She will continue Dilaudid 2 mg every 4 hours as needed. We discussed constipation management. I also sent a prescription for Lidoderm patch to be applied to the local area of pain in the right hip.   Current Treatment: anastrozole started 04/28/16 pathology is not clearly indicative of breast cancer. Because of this I'm not certain that antiestrogen  therapy would be of much help. I did not recommend changing treatment or adding Ibrance this time.    return to clinic in one month for evaluation.    Orders Placed This Encounter  Procedures  . DNR (Do Not Resuscitate)    Order Specific Question:   In the event of cardiac or respiratory ARREST    Answer:   Do not call a "code blue"    Order Specific Question:   In the event of cardiac or respiratory ARREST    Answer:   Do not perform Intubation, CPR, defibrillation or ACLS    Order Specific Question:   In the event of cardiac or respiratory ARREST    Answer:   Use medication by any route, position, wound care, and other measures to relive pain and suffering. May use oxygen, suction and manual treatment of airway obstruction as needed for comfort.   The patient has a good understanding of the overall plan. she agrees with it. she will call with any problems that may develop before the next visit here.   Rulon Eisenmenger, MD 05/22/16

## 2016-05-23 ENCOUNTER — Ambulatory Visit: Payer: Medicare Other

## 2016-05-24 ENCOUNTER — Encounter (HOSPITAL_COMMUNITY): Payer: Self-pay | Admitting: Emergency Medicine

## 2016-05-24 ENCOUNTER — Emergency Department (HOSPITAL_COMMUNITY)
Admission: EM | Admit: 2016-05-24 | Discharge: 2016-05-24 | Disposition: A | Discharge status: Home / Self Care | Payer: Medicare Other | Attending: Emergency Medicine | Admitting: Emergency Medicine

## 2016-05-24 DIAGNOSIS — C50412 Malignant neoplasm of upper-outer quadrant of left female breast: Secondary | ICD-10-CM | POA: Diagnosis not present

## 2016-05-24 DIAGNOSIS — Z79899 Other long term (current) drug therapy: Secondary | ICD-10-CM | POA: Diagnosis not present

## 2016-05-24 DIAGNOSIS — M25551 Pain in right hip: Secondary | ICD-10-CM | POA: Diagnosis present

## 2016-05-24 DIAGNOSIS — Z96642 Presence of left artificial hip joint: Secondary | ICD-10-CM | POA: Diagnosis not present

## 2016-05-24 DIAGNOSIS — I10 Essential (primary) hypertension: Secondary | ICD-10-CM | POA: Diagnosis not present

## 2016-05-24 DIAGNOSIS — Z7901 Long term (current) use of anticoagulants: Secondary | ICD-10-CM | POA: Diagnosis not present

## 2016-05-24 DIAGNOSIS — C7951 Secondary malignant neoplasm of bone: Secondary | ICD-10-CM | POA: Insufficient documentation

## 2016-05-24 LAB — COMPREHENSIVE METABOLIC PANEL
ALBUMIN: 3.4 g/dL — AB (ref 3.5–5.0)
ALK PHOS: 68 U/L (ref 38–126)
ALT: 12 U/L — ABNORMAL LOW (ref 14–54)
ANION GAP: 8 (ref 5–15)
AST: 14 U/L — ABNORMAL LOW (ref 15–41)
BUN: 21 mg/dL — ABNORMAL HIGH (ref 6–20)
CALCIUM: 9.2 mg/dL (ref 8.9–10.3)
CO2: 29 mmol/L (ref 22–32)
Chloride: 101 mmol/L (ref 101–111)
Creatinine, Ser: 0.87 mg/dL (ref 0.44–1.00)
GFR calc non Af Amer: >60 mL/min (ref >60)
GLUCOSE: 109 mg/dL — AB (ref 65–99)
POTASSIUM: 3.5 mmol/L (ref 3.5–5.1)
SODIUM: 138 mmol/L (ref 135–145)
Total Bilirubin: 0.8 mg/dL (ref 0.3–1.2)
Total Protein: 6.6 g/dL (ref 6.5–8.1)

## 2016-05-24 LAB — CBC WITH DIFFERENTIAL/PLATELET
BASOS PCT: 0 %
Basophils Absolute: 0 10*3/uL (ref 0.0–0.1)
EOS ABS: 0.2 10*3/uL (ref 0.0–0.7)
EOS PCT: 2 %
HCT: 38.3 % (ref 36.0–46.0)
Hemoglobin: 12.9 g/dL (ref 12.0–15.0)
LYMPHS ABS: 0.5 10*3/uL — AB (ref 0.7–4.0)
Lymphocytes Relative: 4 %
MCH: 30.6 pg (ref 26.0–34.0)
MCHC: 33.7 g/dL (ref 30.0–36.0)
MCV: 90.8 fL (ref 78.0–100.0)
MONO ABS: 0.8 10*3/uL (ref 0.1–1.0)
MONOS PCT: 8 %
NEUTROS PCT: 86 %
Neutro Abs: 9.1 10*3/uL — ABNORMAL HIGH (ref 1.7–7.7)
PLATELETS: 253 10*3/uL (ref 150–400)
RBC: 4.22 MIL/uL (ref 3.87–5.11)
RDW: 14.4 % (ref 11.5–15.5)
WBC: 10.6 10*3/uL — ABNORMAL HIGH (ref 4.0–10.5)

## 2016-05-24 MED ORDER — SODIUM CHLORIDE 0.9 % IV BOLUS (SEPSIS)
500.0000 mL | Freq: Once | INTRAVENOUS | Status: AC
  Administered 2016-05-24: 500 mL via INTRAVENOUS

## 2016-05-24 MED ORDER — HYDROMORPHONE HCL 1 MG/ML IJ SOLN
1.0000 mg | Freq: Once | INTRAMUSCULAR | Status: AC
  Administered 2016-05-24: 1 mg via INTRAVENOUS
  Filled 2016-05-24: qty 1

## 2016-05-24 NOTE — ED Triage Notes (Signed)
Pt reports right hip and left shoulder pain x 3 weeks, Hx  Breast cancer, no chemo yet last radiation 2 days ago. Denies injury nor fall, denies chest pain nor shortness of breath.

## 2016-05-24 NOTE — ED Provider Notes (Signed)
[facility] DEPT Provider Note   CSN: [id number] Arrival date & time: 05/24/16  1510     History   Chief Complaint Chief Complaint  Patient presents with  . Hip Pain    right   . Shoulder Pain    left     HPI Sherri Tucker is a 81 y.o. female.  HPI Patient presents with ongoing left proximal humerus and right hip pain. She has history of breast cancer with bony metastasis. She is undergoing palliative radiation. She currently is taking Dilaudid every 4 hours and uses fentanyl patches. Her pain has not been controlled at home. She denies any recent changes in her pain. No recent trauma. No fever or chills. She has ongoing constipation but denies nausea, vomiting or abdominal pain. Past Medical History:  Diagnosis Date  . Arthritis   . Arthritis 12/04/2011  . Breast cancer, stage 1 ([place]) 12/04/2011  . Cancer ([place])   . Colon polyp   . Complication of anesthesia   . Depression   . Hemorrhoids   . History of kidney stones   . History of radiation therapy 12/04/2011  . Hypertension   . PONV (postoperative nausea and vomiting)     Patient Active Problem List   Diagnosis Date Noted  . DNR (do not resuscitate) 05/22/2016  . Goals of care, counseling/discussion 05/13/2016  . Secondary malignant neoplasm of bone and bone marrow ([place]) 05/13/2016  . Dehydration 05/06/2016  . Hypokalemia 05/06/2016  . Back pain 05/06/2016  . Atrial fibrillation with RVR ([place]) 11/24/2015  . HTN (hypertension) 11/24/2015  . 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 History of breast cancer 04/12/2012  . Breast cancer of upper-outer quadrant of left female breast ([place]) 12/04/2011  . Arthritis 12/04/2011  . History of radiation therapy 12/04/2011    Past Surgical History:  Procedure Laterality Date  . ABDOMINAL HYSTERECTOMY    . BREAST SURGERY  11   left lumpectomy  . TOTAL HIP ARTHROPLASTY Left 09/25/2014   Procedure: TOTAL HIP ARTHROPLASTY;  Surgeon: [name], MD;  Location: [facility];   Service: Orthopedics;  Laterality: Left;    OB History    No data available       Home Medications    Prior to Admission medications   Medication Sig Start Date End Date Taking? Authorizing Provider  anastrozole (ARIMIDEX) 1 MG tablet Take 1 tablet (1 mg total) by mouth daily. 05/08/16  Yes [name], MD  ELIQUIS 5 MG TABS tablet Take 5 mg by mouth 2 (two) times daily. 04/11/16  Yes Historical Provider, MD  fentaNYL (DURAGESIC - DOSED MCG/HR) 50 MCG/HR Place 1 patch (50 mcg total) onto the skin every 3 (three) days. 05/22/16  Yes [name], MD  hydrochlorothiazide (HYDRODIURIL) 25 MG tablet Take 25 mg by mouth daily.     Yes Historical Provider, MD  HYDROmorphone (DILAUDID) 2 MG tablet Take 1 tablet (2 mg total) by mouth every 4 (four) hours as needed for severe pain. 05/13/16  Yes [name], MD  KLOR-CON M20 20 MEQ tablet Take 20 mEq by mouth daily. 05/05/16  Yes Historical Provider, MD  lisinopril (PRINIVIL,ZESTRIL) 10 MG tablet Take 10 mg by mouth daily.     Yes Historical Provider, MD  metoprolol succinate (TOPROL-XL) 50 MG 24 hr tablet Take 1 tablet (50 mg total) by mouth daily. 11/26/15  Yes Thurnell Lose, MD  sertraline (ZOLOFT) 50 MG tablet Take 25 mg by mouth at bedtime. Patient takes 1/2 tablet daily. Only taking 25  mg total a day. 04/28/11  Yes Historical Provider, MD  lidocaine (LIDODERM) 5 % Place 1 patch onto the skin daily. Remove & Discard patch within 12 hours or as directed by MD Patient not taking: Reported on 05/24/2016 05/22/16   [name], MD    Family History Family History  Problem Relation Age of Onset  . Heart disease Sister     Social History Social History  Substance Use Topics  . Smoking status: Never Smoker  . Smokeless tobacco: Never Used  . Alcohol use No     Allergies   Codeine   Review of Systems Review of Systems  Constitutional: Negative for chills and fever.  Respiratory: Negative for shortness of breath.     Gastrointestinal: Positive for constipation. Negative for abdominal pain, nausea and vomiting.  Musculoskeletal: Positive for arthralgias. Negative for back pain.  Skin: Negative for wound.  Neurological: Negative for weakness.  All other systems reviewed and are negative.    Physical Exam Updated Vital Signs BP 167/89   Pulse 68   Temp 98 F (36.7 C) (Oral)   Resp 17   Ht 5\' 3"  (1.6 m)   Wt 101 lb (45.8 kg)   SpO2 92%   BMI 17.89 kg/m   Physical Exam  Constitutional: She is oriented to person, place, and time. She appears well-developed and well-nourished. No distress.  HENT:  Head: Normocephalic and atraumatic.  Mouth/Throat: Oropharynx is clear and moist. No oropharyngeal exudate.  Eyes: EOM are normal. Pupils are equal, round, and reactive to light.  Neck: Normal range of motion. Neck supple.  Cardiovascular: Normal rate and regular rhythm.  Exam reveals no gallop and no friction rub.   No murmur heard. Pulmonary/Chest: Effort normal and breath sounds normal. No respiratory distress. She has no wheezes. She has no rales. She exhibits no tenderness.  Abdominal: Soft. Bowel sounds are normal. She exhibits distension (mild abdominal distention). There is no tenderness. There is no rebound and no guarding.  Musculoskeletal: She exhibits no edema or tenderness.  Decreased range of motion of the right hip due to pain. No leg shortening or obvious deformity. Full range of motion of the left shoulder. Distal pulses are 2+.  Neurological: She is alert and oriented to person, place, and time.  Moving all extremities without focal deficit. Sensation intact.  Skin: Skin is warm and dry. Capillary refill takes less than 2 seconds. No rash noted. No erythema.  Psychiatric: She has a normal mood and affect. Her behavior is normal.  Nursing note and vitals reviewed.    ED Treatments / Results  Labs (all labs ordered are listed, but only abnormal results are displayed) Labs Reviewed   CBC WITH DIFFERENTIAL/PLATELET - Abnormal; Notable for the following:       Result Value   WBC 10.6 (*)    Neutro Abs 9.1 (*)    Lymphs Abs 0.5 (*)    All other components within normal limits  COMPREHENSIVE METABOLIC PANEL - Abnormal; Notable for the following:    Glucose, Bld 109 (*)    BUN 21 (*)    Albumin 3.4 (*)    AST 14 (*)    ALT 12 (*)    All other components within normal limits    EKG  EKG Interpretation None       Radiology No results found.  Procedures Procedures (including critical care time)  Medications Ordered in ED Medications  sodium chloride 0.9 % bolus 500 mL (0 mLs Intravenous Stopped 05/24/16  2056)  HYDROmorphone (DILAUDID) injection 1 mg (1 mg Intravenous Given 05/24/16 2101)     Initial Impression / Assessment and Plan / ED Course  I have reviewed the triage vital signs and the nursing notes.  Pertinent labs & imaging results that were available during my care of the patient were reviewed by me and considered in my medical decision making (see chart for details).  Clinical Course    Patient states her pain is completely resolved. She is refusing admission and wants to be discharged home. Family is at bedside and will take patient home to follow-up with oncologist. Return precautions given.   Final Clinical Impressions(s) / ED Diagnoses   Final diagnoses:  Bony metastasis ([place])    New Prescriptions New Prescriptions   No medications on file     Julianne Rice, MD 05/24/16 2202

## 2016-05-26 ENCOUNTER — Ambulatory Visit: Payer: Medicare Other

## 2016-05-26 ENCOUNTER — Telehealth: Payer: Self-pay | Admitting: Oncology

## 2016-05-26 NOTE — Telephone Encounter (Signed)
Called patient's son, Sherri Tucker, and verified that Sherri Tucker does not want anymore radiation.  Sherri Tucker said she does not want anymore radiation because it causes her too much pain and "doesn't feel like it's worth it."  He also said they are moving her towards hospice.  Notified [facility].

## 2016-05-27 ENCOUNTER — Encounter: Payer: Self-pay | Admitting: Hematology and Oncology

## 2016-05-27 NOTE — Progress Notes (Signed)
Called to check status of PA for Lidocaine patch. Per [employer] it is still under review.

## 2016-05-29 ENCOUNTER — Encounter: Payer: Self-pay | Admitting: Radiation Oncology

## 2016-05-29 NOTE — Progress Notes (Signed)
  Radiation Oncology         [phone] ________________________________  Name: Sherri Tucker MRN: [id number]  Date: 05/29/2016  DOB: March 25, 1935  End of Treatment Note  Diagnosis:   Right Hip and Left Proximal Humerus symptomatic metastases     Indication for treatment:  Palliative       Radiation treatment dates:   05/15/16 - 05/22/16  Site/dose:   The Left Shoulder was treated to 16 Gy in 4 fractions. The Right Hip was treated to 16 Gy in 4 fractions. The planned dose for each area was 20 Gy delivered in 5 fractions.  Beams/energy:   Left Shoulder : Isodose Plan  //  15X,6X        Right Hip : Isodose Plan  //  15X  Narrative: The patient tolerated radiation treatment relatively well. She reported improvement in her left sided shoulder pain, calling it "tolerable," but reported her hip pain to be 8/10 in severity during the course of treatment. She also experienced dysuria, which began before her third fraction. The patient elected to stop radiation treatments before her final fraction because of the her discomfort. The family reports they are moving the patient to hospice.  Plan: The patient has stopped radiation treatment. The patient will return to radiation oncology clinic for routine followup in one month, as able. I advised them to call or return sooner if they have any questions or concerns related to their recovery or treatment.  -----------------------------------  Blair Promise, PhD, MD  This document serves as a record of services personally performed by Gery Pray, MD. It was created on his behalf by Maryla Morrow, a trained medical scribe. The creation of this record is based on the scribe's personal observations and the provider's statements to them. This document has been checked and approved by the attending provider.

## 2016-06-11 ENCOUNTER — Encounter: Payer: Self-pay | Admitting: Radiation Oncology

## 2016-06-11 ENCOUNTER — Encounter (HOSPITAL_COMMUNITY): Payer: Self-pay

## 2016-06-13 ENCOUNTER — Encounter: Payer: Self-pay | Admitting: Hematology and Oncology

## 2016-06-13 NOTE — Progress Notes (Signed)
Called patient to advise of approval for Lidocaine patch. Left voicemail reagrding this and contact number to [email or web address] [phone].

## 2016-06-13 NOTE — Progress Notes (Signed)
Received approval from [facility] for Lidocaine.  Lidocaine approved 06/11/16- 06/08/2017. Called [facility]) to advise of the approval. She ran the medication and said copay of $64.

## 2016-06-20 ENCOUNTER — Other Ambulatory Visit: Payer: Self-pay | Admitting: *Deleted

## 2016-07-10 DEATH — deceased

## 2017-12-09 IMAGING — CT NM PET TUM IMG RESTAG (PS) SKULL BASE T - THIGH
8 series · 25 of 25 positions shown · non-contrast
Comparison: Left shoulderMRI OF 04/10/16. Abdominal pelvic CT/stone
study of 06/13/2014.

CLINICAL DATA: Initial Treatment strategy for left-sided breast
cancer. Left humeral mass.. Stage I breast cancer in 2264.

EXAM:
NUCLEAR MEDICINE PET SKULL BASE TO THIGH
TECHNIQUE: 5.3 mCi F-18 FDG was injected intravenously. Full-ring PET imaging
was performed from the skull base to thigh after the radiotracer. CT
data was obtained and used for attenuation correction and anatomic
localization.
FASTING BLOOD GLUCOSE:  Value: 105 mg/dl

[Series 3: pet sk_thigh ac · axial · 5.0mm · 4.07mm/px · z∈[-1040,-252]mm · 5 of 198 slices shown]
[im 1/198]
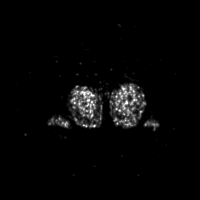
[im 50/198]
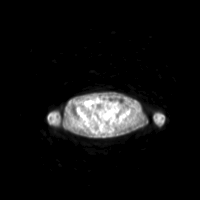
[im 99/198]
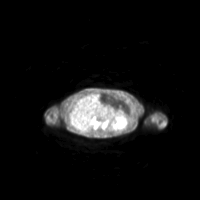
[im 148/198]
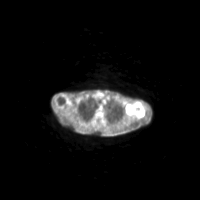
[im 198/198]
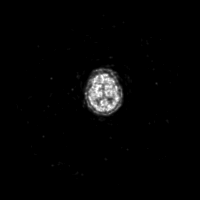

[Series 4: ct sk_thigh 5.0 hd_fov · axial · 5.0mm · 1.07mm/px · z∈[-1040,-252]mm · 5 of 198 slices shown]
[im 1/198]
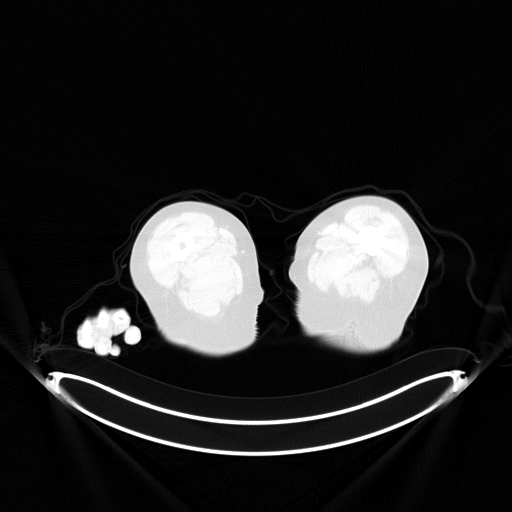
[im 50/198]
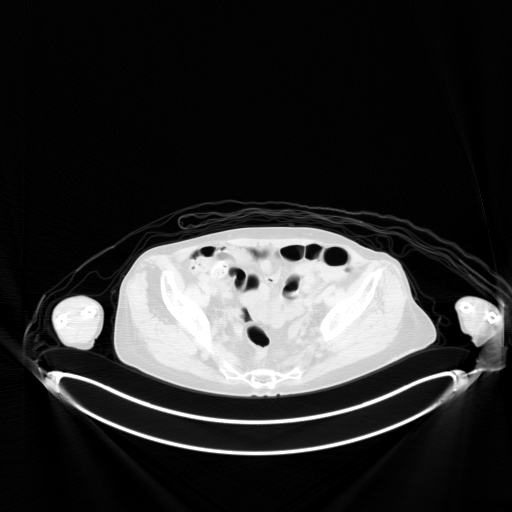
[im 99/198]
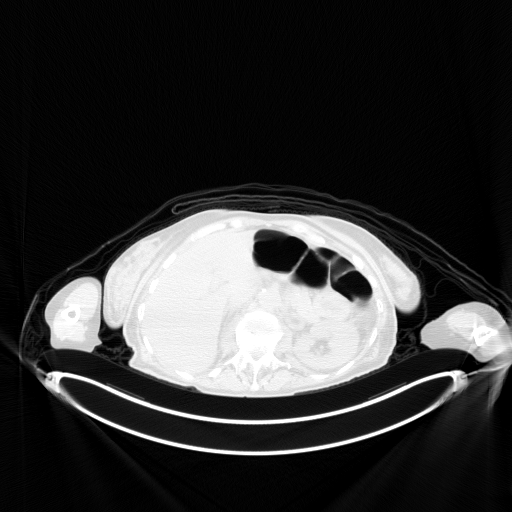
[im 148/198]
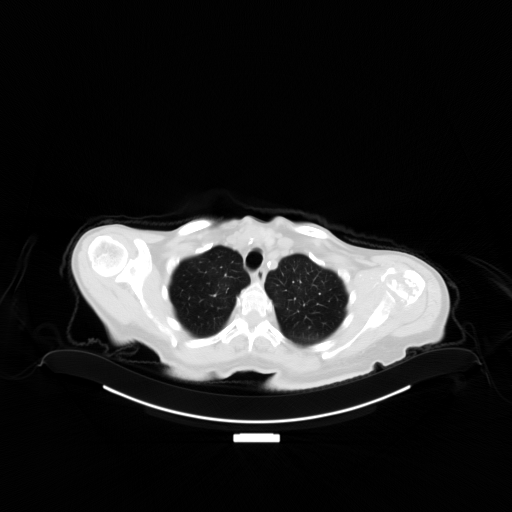
[im 198/198]
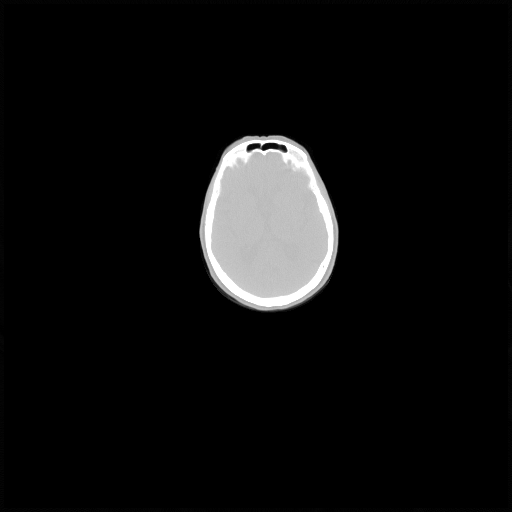

[Series 7: pet sk_thigh nac · axial · 5.0mm · 4.07mm/px · z∈[-1040,-252]mm · 5 of 198 slices shown]
[im 1/198]
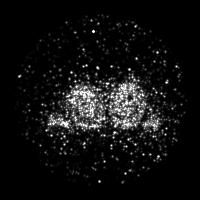
[im 50/198]
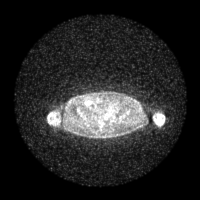
[im 99/198]
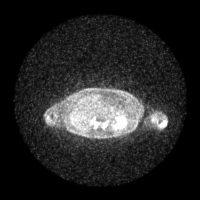
[im 148/198]
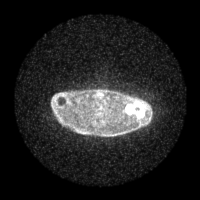
[im 198/198]
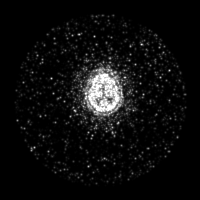

[Series 8: ct sk_thigh 5.0 b70f (id)_bone · axial · 5.0mm · 0.62mm/px · 1 of 56 slices shown]
[im 1/56  bone]
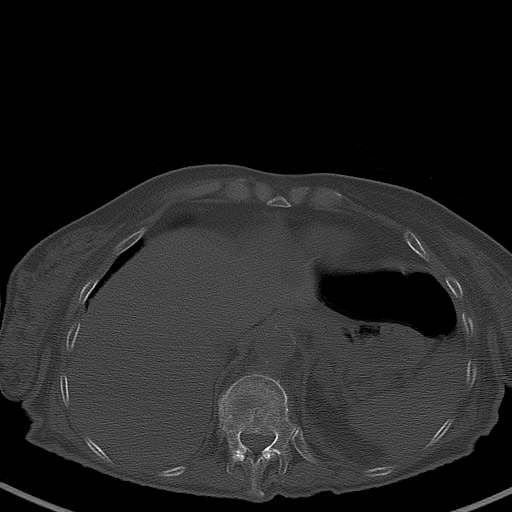

[Series 603: range-ct sk_thigh 5.0 hd_fov-cor-<alpha range> · 2 of 86 slices shown]
[im 1/86]
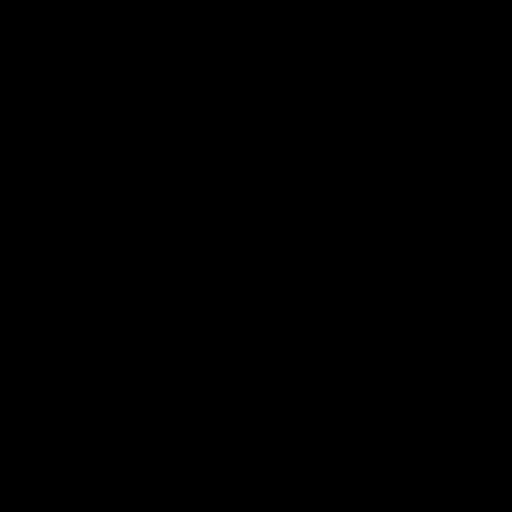
[im 86/86]
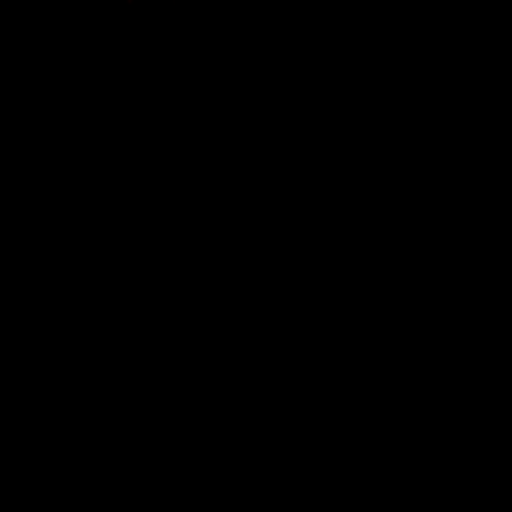

[Series 604: mip collection · coronal · 1.68mm/px · 1 of 32 slices shown]
[im 1/32]
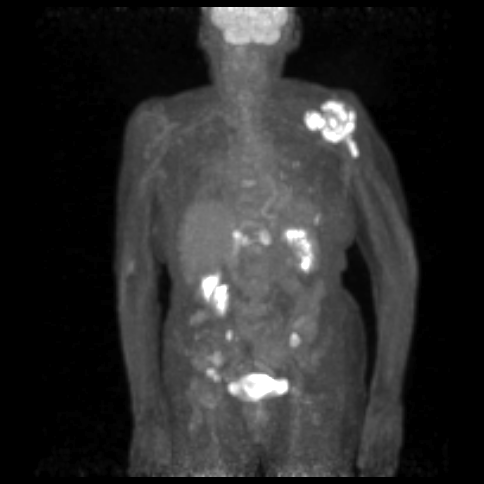

[Series 605: range-ct sk_thigh 5.0 hd_fov-tra-<alpha range> · 5 of 175 slices shown]
[im 1/175]
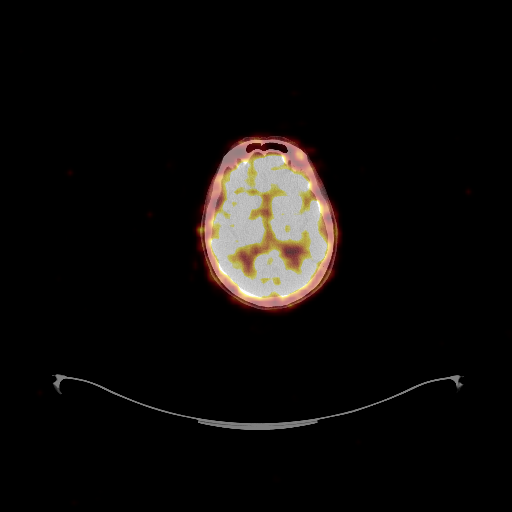
[im 44/175]
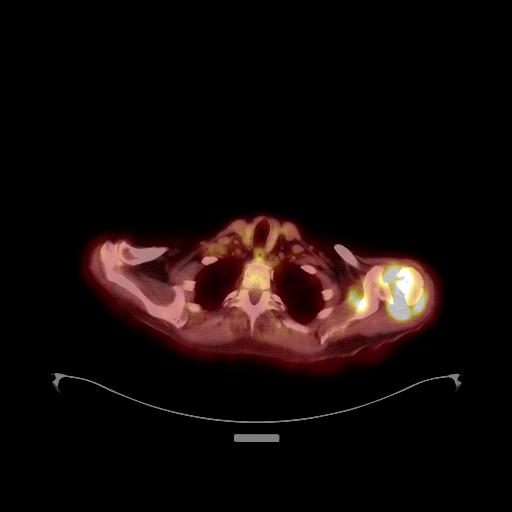
[im 88/175]
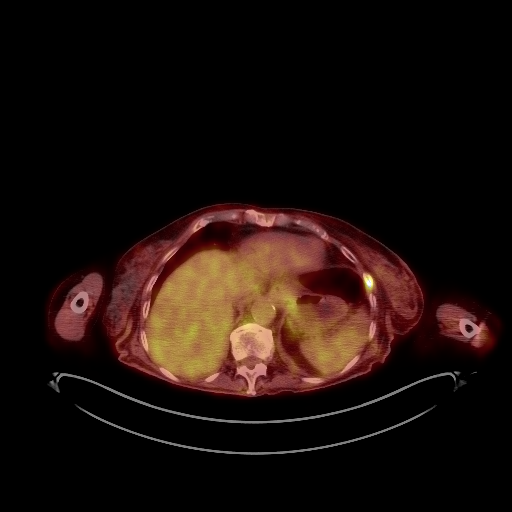
[im 131/175]
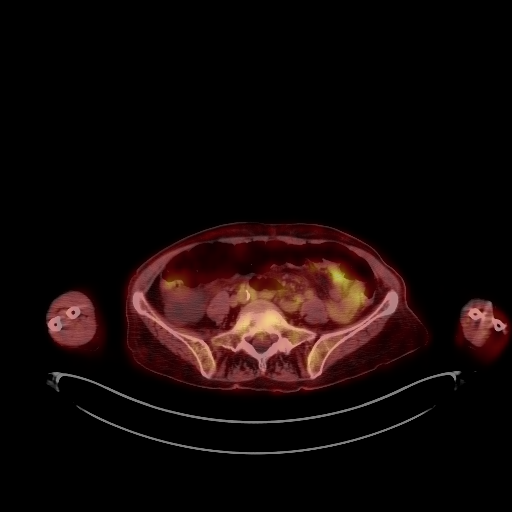
[im 175/175]
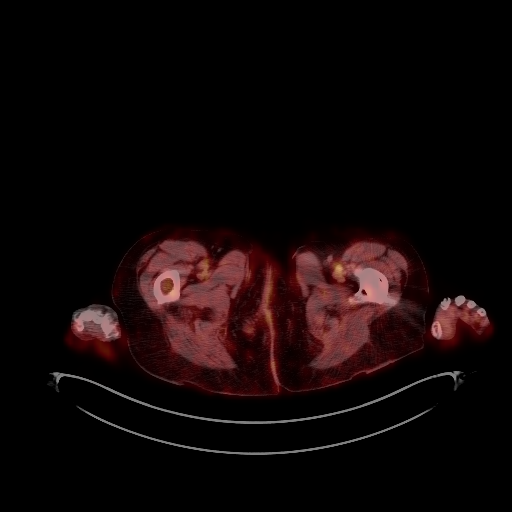

[Series 1032: results mm oncology reading · 4.0mm · 0.81mm/px · 1 of 7 slices shown]
[im 1/7]
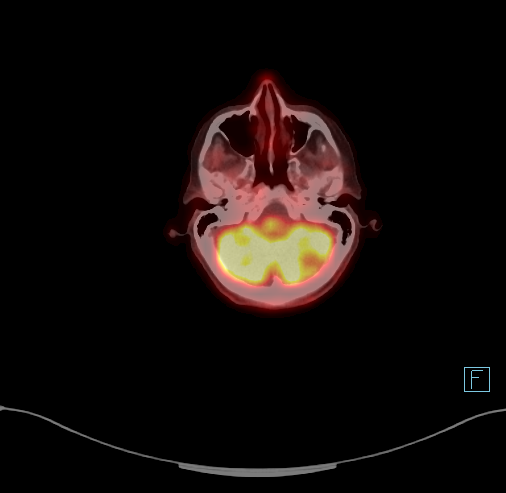

[25 of 25 positions shown; findings below may reference images not displayed]

FINDINGS: NECK

Relative hypermetabolism involving the periphery of the left
cerebellar hemisphere may relate to atrophy, given suggestion of
hypoattenuation in this area on image 13/series 3. No cervical nodal
hypermetabolism. No cervical adenopathy.

CHEST

No thoracic nodal or pulmonary parenchymal hypermetabolism. Mild
cardiomegaly. Small bilateral pleural effusions. No axillary
adenopathy. Bibasilar atelectasis.

ABDOMEN/PELVIS

11 mm left adrenal nodule measures a S.U.V. max of 4.3 on image
94/series 4. This is low in density, favoring an adenoma.

No other soft tissue hypermetabolism identified, including within
the abdominal pelvic nodal stations or parenchymal organs. Bilateral
renal cysts and collecting system calculi. Large volume lower pole
right renal collecting system stones. Aortic and branch vessel
atherosclerosis. Degraded evaluation of the pelvis, secondary to
beam hardening artifact from left hip arthroplasty.

SKELETON

Marked hypermetabolism corresponding to the proximal left humerus
lesion with soft tissue component and pathologic fracture. This
measures a S.U.V. max of 23.4. Suspected osseous lesion at the right
skullbase, a focus of hypermetabolism measuring a S.U.V. max of
on image 16/ series 4.

Multifocal pelvic hypermetabolism, including a lesion about the
posterior and superior aspect of the right acetabulum. This measures
a S.U.V. max of 10.6. Hypermetabolism is identified about the
periphery of the L1 vertebral body, which has undergone interval
compression deformity since the CT of 06/13/2014. There is also
chronic compression deformity of T12. Superior T9 hypermetabolic
metastasis.

Left hip arthroplasty.  Right hip osteoarthritis.  Osteopenia.
IMPRESSION: 1. Multifocal hypermetabolic osseous lesions, consistent with
metastasis. The dominant lesion in the left proximal humerus is
significantly hypermetabolic. In addition, there is L1
hypermetabolism with presumed secondary interval compression
deformity. A T12 compression deformity is chronic.
2. Hypermetabolic left adrenal nodule is favored to represent an
adenoma, given low-density. Otherwise, no evidence of soft Tissue
metastasis.
3. Small bilateral pleural effusions.
4. Bilateral nephrolithiasis.
5. Suspect focal encephalomalacia involving the left cerebellar
hemisphere with resultant hypometabolism.
